# Patient Record
Sex: Male | Born: 1965 | Race: White | Hispanic: No | Marital: Married | Smoking: Never smoker
Health system: Southern US, Community
[De-identification: ages and names within clinical notes are randomized; demographics above are authoritative.]

## PROBLEM LIST (undated history)

## (undated) DIAGNOSIS — I2699 Other pulmonary embolism without acute cor pulmonale: Secondary | ICD-10-CM

## (undated) HISTORY — DX: Other pulmonary embolism without acute cor pulmonale: I26.99

---

## 1988-11-07 HISTORY — PX: OTHER SURGICAL HISTORY: SHX169

## 2006-09-30 ENCOUNTER — Ambulatory Visit (HOSPITAL_COMMUNITY): Admission: RE | Admit: 2006-09-30 | Discharge: 2006-09-30 | Payer: Self-pay | Admitting: Cardiology

## 2011-11-04 ENCOUNTER — Other Ambulatory Visit (HOSPITAL_COMMUNITY): Payer: Self-pay | Admitting: Orthopedic Surgery

## 2011-11-04 DIAGNOSIS — M84363A Stress fracture, right fibula, initial encounter for fracture: Secondary | ICD-10-CM

## 2011-11-12 ENCOUNTER — Encounter (HOSPITAL_COMMUNITY): Payer: Self-pay

## 2011-11-12 ENCOUNTER — Other Ambulatory Visit (HOSPITAL_COMMUNITY): Payer: Self-pay

## 2011-11-16 ENCOUNTER — Encounter (HOSPITAL_COMMUNITY)
Admission: RE | Admit: 2011-11-16 | Discharge: 2011-11-16 | Disposition: A | Discharge status: Home / Self Care | Source: Ambulatory Visit | Attending: Orthopedic Surgery | Admitting: Orthopedic Surgery

## 2011-11-16 DIAGNOSIS — M84369A Stress fracture, unspecified tibia and fibula, initial encounter for fracture: Secondary | ICD-10-CM | POA: Insufficient documentation

## 2011-11-16 DIAGNOSIS — M84363A Stress fracture, right fibula, initial encounter for fracture: Secondary | ICD-10-CM

## 2011-11-16 DIAGNOSIS — X58XXXA Exposure to other specified factors, initial encounter: Secondary | ICD-10-CM | POA: Insufficient documentation

## 2011-11-16 MED ORDER — TECHNETIUM TC 99M MEDRONATE IV KIT
25.0000 | PACK | Freq: Once | INTRAVENOUS | Status: AC | PRN
  Administered 2011-11-16: 25 via INTRAVENOUS

## 2012-05-30 ENCOUNTER — Telehealth: Payer: Self-pay | Admitting: Physician Assistant

## 2012-05-30 NOTE — Telephone Encounter (Signed)
wtbs today or tomorrow with acm

## 2012-05-30 NOTE — Telephone Encounter (Signed)
APPT MADE

## 2012-06-03 ENCOUNTER — Telehealth: Payer: Self-pay | Admitting: Nurse Practitioner

## 2012-06-03 NOTE — Telephone Encounter (Signed)
Patient wants inr checked when comes in

## 2012-06-22 ENCOUNTER — Ambulatory Visit (INDEPENDENT_AMBULATORY_CARE_PROVIDER_SITE_OTHER): Admitting: Nurse Practitioner

## 2012-06-22 ENCOUNTER — Encounter: Payer: Self-pay | Admitting: Nurse Practitioner

## 2012-06-22 VITALS — BP 128/79 | HR 74 | Temp 98.1°F | Ht 72.0 in | Wt 204.0 lb

## 2012-06-22 DIAGNOSIS — I2699 Other pulmonary embolism without acute cor pulmonale: Secondary | ICD-10-CM

## 2012-06-22 NOTE — Progress Notes (Signed)
  Subjective:    Patient ID: Allen Watson, male    DOB: 1965-07-27, 47 y.o.   MRN: [id number]  HPI- Patient in stating that had pulmonary emboli in 2008. Has been on Coumadin since then. Sees clinical pharmacist monthly. Regulated without in bleeding or sideeffects. 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    Review of Systems  All other systems reviewed and are negative.       Objective:   Physical Exam  Constitutional: He is oriented to person, place, and time. He appears well-developed and well-nourished.  Cardiovascular: Normal rate, regular rhythm, normal heart sounds and intact distal pulses.   Pulmonary/Chest: Effort normal and breath sounds normal.  Neurological: He is alert and oriented to person, place, and time.  Psychiatric: He has a normal mood and affect. His behavior is normal. Judgment and thought content normal.   BP 128/79  Pulse 74  Temp(Src) 98.1 F (36.7 C) (Oral)  Ht 6' (1.829 m)  Wt 204 lb (92.534 kg)  BMI 27.66 kg/m2        Assessment & Plan:  Hx pulmonary emboli with anitcoag treatment  Letter written for flying planes  Continue all meds RTO in 6 months [name] [name], FNP

## 2012-06-22 NOTE — Addendum Note (Signed)
Addended by: Prescott Gum on: 06/22/2012 12:50 PM   Modules accepted: Orders

## 2012-06-22 NOTE — Addendum Note (Signed)
Addended by: [name] on: 06/22/2012 12:43 PM   Modules accepted: Orders

## 2012-06-22 NOTE — Patient Instructions (Addendum)
Pulmonary Embolus A pulmonary (lung) embolus (PE) is a blood clot that has traveled from another place in the body to the lung. Most clots come from deep veins in the legs or pelvis. PE is a dangerous and potentially life-threatening condition that can be treated if identified. CAUSES Blood clots form in a vein for different reasons. Usually several things cause blood clots. They include:  The flow of blood slows down.  The inside of the vein is damaged in some way.  The person has a condition that makes the blood clot more easily. These conditions may include:  Older age (especially over 75 years old).  Having a history of blood clots.  Having major or lengthy surgery. Hip surgery is particularly high-risk.  Breaking a hip or leg.  Sitting or lying still for a long time.  Cancer or cancer treatment.  Having a long, thin tube (catheter) placed inside a vein during a medical procedure.  Being overweight (obese).  Pregnancy and childbirth.  Medicines with estrogen.  Smoking.  Other circulation or heart problems. SYMPTOMS  The symptoms of a PE usually start suddenly and include:  Shortness of breath.  Coughing.  Coughing up blood or blood-tinged mucus (phlegm).  Chest pain. Pain is often worse with deep breaths.  Rapid heartbeat. DIAGNOSIS  If a PE is suspected, your caregiver will take a medical history and carry out a physical exam. Your caregiver will check for the risk factors listed above. Tests that also may be required include:  Blood tests, including studies of the clotting properties of your blood.  Imaging tests. Ultrasound, CT, MRI, and other tests can all be used to see if you have clots in your legs or lungs. If you have a clot in your legs and have breathing or chest problems, your caregiver may conclude that you have a clot in your lungs. Further lung tests may not be needed.  Electrocardiography can look for heart strain from blood clots in the  lungs. PREVENTION   Exercise the legs regularly. Take a brisk 30 minute walk every day.  Maintain a weight that is appropriate for your height.  Avoid sitting or lying in bed for long periods of time without moving your legs.  Women, particularly those over the age of 35, should consider the risks and benefits of taking estrogen medicines, including birth control pills.  Do not smoke, especially if you take estrogen medicines.  Long-distance travel can increase your risk. You should exercise your legs by walking or pumping the muscles every hour.  In hospital prevention:  Your caregiver will assess your need for preventive PE care (prophylaxis) when you are admitted to the hospital. If you are having surgery, your surgeon will assess you the day of or day after surgery.  Prevention may include medical and nonmedical measures. TREATMENT   The most common treatment for a PE is blood thinning (anticoagulant) medicine, which reduces the blood's tendency to clot. Anticoagulants can stop new blood clots from forming and old ones from growing. They cannot dissolve existing clots. Your body does this by itself over time. Anticoagulants can be given by mouth, by intravenous (IV) access, or by injection. Your caregiver will determine the best program for you.  Less commonly, clot-dissolving drugs (thrombolytics) are used to dissolve a PE. They carry a high risk of bleeding, so they are used mainly in severe cases.  Very rarely, a blood clot in the leg needs to be removed surgically.  If you are unable to   take anticoagulants, your caregiver may arrange for you to have a filter placed in a main vein in your abdomen. This filter prevents clots from traveling to your lungs. HOME CARE INSTRUCTIONS   Take all medicines prescribed by your caregiver. Follow the directions carefully.  Warfarin. Most people will continue taking warfarin after hospital discharge. Your caregiver will advise you on the  length of treatment (usually 3 6 months, sometimes lifelong).  Too much and too little warfarin are both dangerous. Too much warfarin increases the risk of bleeding. Too little warfarin continues to allow the risk for blood clots. While taking warfarin, you will need to have regular blood tests to measure your blood clotting time. These blood tests usually include both the prothrombin time (PT) and International Normalized Ratio (INR) tests. The PT and INR results allow your caregiver to adjust your dose of warfarin. The dose can change for many reasons. It is critically important that you take warfarin exactly as prescribed, and that you have your PT and INR levels drawn exactly as directed.  Many foods, especially foods high in vitamin K can interfere with warfarin and affect the PT and INR results. Foods high in vitamin K include spinach, kale, broccoli, cabbage, collard and turnip greens, brussels sprouts, peas, cauliflower, seaweed, and parsley as well as beef and pork liver, green tea, and soybean oil. You should eat a consistent amount of foods high in vitamin K. Avoid major changes in your diet, or notify your caregiver before changing your diet. Arrange a visit with a dietitian to answer your questions.  Many medicines can interfere with warfarin and affect the PT and INR results. You must tell your caregiver about any and all medicines you take, this includes all vitamins and supplements. Be especially cautious with aspirin and anti-inflammatory medicines. Ask your caregiver before taking these. Do not take or discontinue any prescribed or over-the-counter medicine except on the advice of your caregiver or pharmacist.  Warfarin can have side effects, such as excessive bruising or bleeding. You will need to hold pressure over cuts for longer than usual.  Alcohol can change the body's ability to handle warfarin. It is best to avoid alcoholic drinks or consume only very small amounts while taking  warfarin. Notify your caregiver if you change your alcohol intake.  Notify your dentist or other caregivers before procedures.  Avoid contact sports.  Wear a medical alert bracelet or carry a medical alert card.  Ask your caregiver how soon you can go back to normal activities. Not being active can lead to new clots. Ask for a list of what you should and should not do.  Compression stockings. These are tight elastic stockings that apply pressure to the lower legs. This can help keep the blood in the legs from clotting. You may need to wear compressions stockings at home to help prevent clots.  Smoking. If you smoke, quit. Ask your caregiver for help with quitting smoking.  Learn as much as you can about PE. Educating yourself can help prevent PE from reoccurring. SEEK MEDICAL CARE IF:   You notice a rapid heartbeat.  You feel weaker or more tired than usual.  You feel faint.  You notice increased bruising.  Your symptoms are not getting better in the time expected.  You are having side effects of medicine. SEEK IMMEDIATE MEDICAL CARE IF:   You have chest pain.  You have trouble breathing.  You have new or increased swelling or pain in one leg.  You   cough up blood.  You notice blood in vomit, in a bowel movement, or in urine.  You have an oral temperature above 102 F (38.9 C), not controlled by medicine. You may have another PE. A blood clot in the lungs is a medical emergency. Call your local emergency services (911 in [place] to get to the nearest hospital or clinic. Do not drive yourself. MAKE SURE YOU:   Understand these instructions.  Will watch your condition.  Will get help right away if you are not doing well or get worse. Document Released: 02/21/2000 Document Revised: 08/25/2011 Document Reviewed: 08/27/2008 [facility] Patient Information 2013 [place], [place]. Anticoagulation Dose Instructions as of 06/22/2012     Glynis Smiles Tue Wed Thu Fri Sat   New Dose 10 mg  15 mg 10 mg 15 mg 10 mg 15 mg 10 mg     Recheck INR in 1 month

## 2012-06-28 NOTE — Telephone Encounter (Signed)
Pt was seen and had inr checked

## 2012-07-01 ENCOUNTER — Ambulatory Visit (INDEPENDENT_AMBULATORY_CARE_PROVIDER_SITE_OTHER): Admitting: Nurse Practitioner

## 2012-07-01 VITALS — BP 150/90 | HR 68 | Temp 98.0°F | Ht 72.0 in | Wt 205.0 lb

## 2012-07-01 DIAGNOSIS — R079 Chest pain, unspecified: Secondary | ICD-10-CM

## 2012-07-01 LAB — COMPLETE METABOLIC PANEL WITH GFR
AST: 27 U/L (ref 0–37)
Albumin: 4.5 g/dL (ref 3.5–5.2)
Alkaline Phosphatase: 63 U/L (ref 39–117)
Glucose, Bld: 87 mg/dL (ref 70–99)
Potassium: 4.5 mEq/L (ref 3.5–5.3)
Sodium: 142 mEq/L (ref 135–145)
Total Protein: 8 g/dL (ref 6.0–8.3)

## 2012-07-01 NOTE — Patient Instructions (Addendum)
Chest Pain (Nonspecific) It is often hard to give a specific diagnosis for the cause of chest pain. There is always a chance that your pain could be related to something serious, such as a heart attack or a blood clot in the lungs. You need to follow up with your caregiver for further evaluation. CAUSES   Heartburn.  Pneumonia or bronchitis.  Anxiety or stress.  Inflammation around your heart (pericarditis) or lung (pleuritis or pleurisy).  A blood clot in the lung.  A collapsed lung (pneumothorax). It can develop suddenly on its own (spontaneous pneumothorax) or from injury (trauma) to the chest.  Shingles infection (herpes zoster virus). The chest wall is composed of bones, muscles, and cartilage. Any of these can be the source of the pain.  The bones can be bruised by injury.  The muscles or cartilage can be strained by coughing or overwork.  The cartilage can be affected by inflammation and become sore (costochondritis). DIAGNOSIS  Lab tests or other studies, such as X-rays, electrocardiography, stress testing, or cardiac imaging, may be needed to find the cause of your pain.  TREATMENT   Treatment depends on what may be causing your chest pain. Treatment may include:  Acid blockers for heartburn.  Anti-inflammatory medicine.  Pain medicine for inflammatory conditions.  Antibiotics if an infection is present.  You may be advised to change lifestyle habits. This includes stopping smoking and avoiding alcohol, caffeine, and chocolate.  You may be advised to keep your head raised (elevated) when sleeping. This reduces the chance of acid going backward from your stomach into your esophagus.  Most of the time, nonspecific chest pain will improve within 2 to 3 days with rest and mild pain medicine. HOME CARE INSTRUCTIONS   If antibiotics were prescribed, take your antibiotics as directed. Finish them even if you start to feel better.  For the next few days, avoid physical  activities that bring on chest pain. Continue physical activities as directed.  Do not smoke.  Avoid drinking alcohol.  Only take over-the-counter or prescription medicine for pain, discomfort, or fever as directed by your caregiver.  Follow your caregiver's suggestions for further testing if your chest pain does not go away.  Keep any follow-up appointments you made. If you do not go to an appointment, you could develop lasting (chronic) problems with pain. If there is any problem keeping an appointment, you must call to reschedule. SEEK MEDICAL CARE IF:   You think you are having problems from the medicine you are taking. Read your medicine instructions carefully.  Your chest pain does not go away, even after treatment.  You develop a rash with blisters on your chest. SEEK IMMEDIATE MEDICAL CARE IF:   You have increased chest pain or pain that spreads to your arm, neck, jaw, back, or abdomen.  You develop shortness of breath, an increasing cough, or you are coughing up blood.  You have severe back or abdominal pain, feel nauseous, or vomit.  You develop severe weakness, fainting, or chills.  You have a fever. THIS IS AN EMERGENCY. Do not wait to see if the pain will go away. Get medical help at once. Call your local emergency services (911 in [place] Do not drive yourself to the hospital. MAKE SURE YOU:   Understand these instructions.  Will watch your condition.  Will get help right away if you are not doing well or get worse. Document Released: 12/03/2004 Document Revised: 05/18/2011 Document Reviewed: 09/29/2007 [employer] Patient Information 2013 [employer].  

## 2012-07-01 NOTE — Progress Notes (Signed)
  Subjective:    Patient ID: Allen Watson, male    DOB: 23-Jan-1966, 47 y.o.   MRN: [id number]  HPIPatient in today C/O chest pain. Started Monday night and has had it everyday intermittently . Describes pain as a dull ache. Rates pain 2/10. Exercise seems to help pain. Nothing seems to make it worse. Patient has been exercising and denies SOB with exertion. Family hx- dad irregular heart beat- PGF diad of heart attack in upper 50's    Review of Systems  Constitutional: Negative.   Eyes: Negative.   Respiratory: Positive for chest tightness. Negative for cough, shortness of breath and stridor.   Cardiovascular: Positive for chest pain. Negative for palpitations and leg swelling.  Genitourinary: Negative.   Musculoskeletal: Negative.   Hematological: Negative.   Psychiatric/Behavioral: Negative.        Objective:   Physical Exam  Constitutional: He appears well-developed and well-nourished.  Cardiovascular: Normal rate, regular rhythm and normal heart sounds.   EKG NSR  Pulmonary/Chest: Effort normal and breath sounds normal. No respiratory distress. He has no wheezes. He has no rales. He exhibits no tenderness.  Skin: Skin is warm and dry.  Psychiatric: He has a normal mood and affect. His behavior is normal. Judgment and thought content normal.   BP 150/90  Pulse 68  Temp(Src) 98 F (36.7 C) (Oral)  Ht 6' (1.829 m)  Wt 205 lb (92.987 kg)  BMI 27.8 kg/m2        Assessment & Plan:  1. 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

## 2012-07-04 ENCOUNTER — Encounter: Payer: Self-pay | Admitting: Cardiovascular Disease

## 2012-07-04 ENCOUNTER — Ambulatory Visit (INDEPENDENT_AMBULATORY_CARE_PROVIDER_SITE_OTHER): Admitting: Cardiovascular Disease

## 2012-07-04 VITALS — BP 132/86 | HR 69 | Ht 72.0 in | Wt 206.0 lb

## 2012-07-04 DIAGNOSIS — R079 Chest pain, unspecified: Secondary | ICD-10-CM | POA: Insufficient documentation

## 2012-07-04 DIAGNOSIS — I2699 Other pulmonary embolism without acute cor pulmonale: Secondary | ICD-10-CM

## 2012-07-04 LAB — NMR LIPOPROFILE WITH LIPIDS
Cholesterol, Total: 252 mg/dL — ABNORMAL HIGH (ref <200)
Large HDL-P: 9.7 umol/L (ref >=4.8)
Large VLDL-P: 1.1 nmol/L (ref <=2.7)
Triglycerides: 114 mg/dL (ref <150)

## 2012-07-04 NOTE — Addendum Note (Signed)
Addended by: Scherrie Bateman E on: 07/04/2012 12:03 PM   Modules accepted: Orders

## 2012-07-04 NOTE — Assessment & Plan Note (Signed)
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 Would also check CXR to clear aorta and mediastinum

## 2012-07-04 NOTE — Progress Notes (Signed)
Patient ID: Allen Watson, male   DOB: [birth year], 47 y.o.   MRN: [id number] 47 yo referred by primary for chest pain,  4.25  C/O chest pain. Started Monday night and has had it everyday intermittently . Describes pain as a dull ache. Rates pain 2/10. Exercise seems to help pain. Nothing seems to make it worse. Patient has been exercising and denies SOB with exertion. 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 Discussed issues with long term anticoagulation and possibility using xarelto instead as it has an indication for DVT/PE  Family hx- dad irregular heart beat- PGF diad of heart attack in upper 50's  ROS: Denies fever, malais, weight loss, blurry vision, decreased visual acuity, cough, sputum, SOB, hemoptysis, pleuritic pain, palpitaitons, heartburn, abdominal pain, melena, lower extremity edema, claudication, or rash.  All other systems reviewed and negative   General: Affect appropriate Healthy:  appears stated age HEENT: normal Neck supple with no adenopathy JVP normal no bruits no thyromegaly Lungs clear with no wheezing and good diaphragmatic motion Heart:  S1/S2 no murmur,rub, gallop or click PMI normal Abdomen: benighn, BS positve, no tenderness, no AAA no bruit.  No HSM or HJR Distal pulses intact with no bruits No edema Neuro non-focal Skin warm and dry No muscular weakness  Medications Current Outpatient Prescriptions  Medication Sig Dispense Refill  . warfarin (COUMADIN) 10 MG tablet Take 10 mg by mouth daily.       No current facility-administered medications for this visit.    Allergies Review of patient's allergies indicates no known allergies.  Family History: Family History  Problem Relation Age of Onset  . Bipolar disorder Mother   . Diabetes Father     Social History: History   Social History  . Marital Status: Married    Spouse Name: N/A    Number of Children: N/A  . Years of  Education: N/A   Occupational History  . Not on file.   Social History Main Topics  . Smoking status: Never Smoker   . Smokeless tobacco: Not on file  . Alcohol Use: Yes  . Drug Use: No  . Sexually Active: Not on file   Other Topics Concern  . Not on file   Social History Narrative  . No narrative on file    Electrocardiogram:4/25  NSR normal ECG   Assessment and Plan

## 2012-07-04 NOTE — Assessment & Plan Note (Signed)
Encouraged him to f/u with primary and or hematology Not clear to me that he should be on life long anticoagulaton of hypercoag tests were negative and first PE diagnosis in 2004 was only "possible" He is young and the cumulative life time risk of coumadin is not trivial

## 2012-07-04 NOTE — Patient Instructions (Addendum)
Your physician has requested that you have an exercise tolerance test. For further information please visit [email or web address] Please also follow instruction sheet, as given.  ASAP  MAY HAVE DONE AT HOSPITAL  Your physician recommends that you continue on your current medications as directed. Please refer to the Current Medication list given to you today. A chest x-ray takes a picture of the organs and structures inside the chest, including the heart, lungs, and blood vessels. This test can show several things, including, whether the heart is enlarges; whether fluid is building up in the lungs; and whether pacemaker / defibrillator leads are still in place.

## 2012-07-05 ENCOUNTER — Ambulatory Visit (HOSPITAL_COMMUNITY)
Admission: RE | Admit: 2012-07-05 | Discharge: 2012-07-05 | Disposition: A | Discharge status: Home / Self Care | Source: Ambulatory Visit | Attending: Cardiovascular Disease | Admitting: Cardiovascular Disease

## 2012-07-05 ENCOUNTER — Encounter: Payer: Self-pay | Admitting: Physician Assistant

## 2012-07-05 ENCOUNTER — Telehealth: Payer: Self-pay | Admitting: Nurse Practitioner

## 2012-07-05 DIAGNOSIS — I2699 Other pulmonary embolism without acute cor pulmonale: Secondary | ICD-10-CM

## 2012-07-05 DIAGNOSIS — R079 Chest pain, unspecified: Secondary | ICD-10-CM

## 2012-07-05 DIAGNOSIS — Z86711 Personal history of pulmonary embolism: Secondary | ICD-10-CM | POA: Insufficient documentation

## 2012-07-05 DIAGNOSIS — M47814 Spondylosis without myelopathy or radiculopathy, thoracic region: Secondary | ICD-10-CM | POA: Insufficient documentation

## 2012-07-05 DIAGNOSIS — R0602 Shortness of breath: Secondary | ICD-10-CM | POA: Insufficient documentation

## 2012-07-05 NOTE — Telephone Encounter (Signed)
Pt aware of lab results 

## 2012-07-05 NOTE — Progress Notes (Signed)
ETT done. Patient exercised 15:00 on Bruce protocol without acute EKG changes or arrhythmia. Excellent exercise tolerance. No CP with exertion. Await official read in Epic but preliminarily negative. Given that he may need clearance from [employer], I have given his original EKG tracings from the stress test to Daun Peacock, clinical site manager, who will be helping to get them to the office via courier. I also placed a note on these tracings that they should go into patient's paper chart at the office. Patient left stress lab in good condition. Dayna Dunn PA-C

## 2012-07-07 ENCOUNTER — Telehealth: Payer: Self-pay | Admitting: Cardiovascular Disease

## 2012-07-07 NOTE — Telephone Encounter (Signed)
PT AWARE OF GXT AND CXR  RESULTS  NORMAL PER DR NISHAN./CY

## 2012-07-07 NOTE — Telephone Encounter (Signed)
New problem   Pt had stress test and cxr and want to know results of test and speak to a nurse. Please call pt

## 2012-07-08 ENCOUNTER — Encounter: Payer: Self-pay | Admitting: Cardiovascular Disease

## 2012-07-14 ENCOUNTER — Ambulatory Visit (INDEPENDENT_AMBULATORY_CARE_PROVIDER_SITE_OTHER): Admitting: Pharmacist

## 2012-07-14 ENCOUNTER — Telehealth: Payer: Self-pay | Admitting: Cardiovascular Disease

## 2012-07-14 DIAGNOSIS — Z86711 Personal history of pulmonary embolism: Secondary | ICD-10-CM

## 2012-07-14 DIAGNOSIS — Z7901 Long term (current) use of anticoagulants: Secondary | ICD-10-CM

## 2012-07-14 DIAGNOSIS — I2699 Other pulmonary embolism without acute cor pulmonale: Secondary | ICD-10-CM

## 2012-07-14 NOTE — Telephone Encounter (Signed)
New Problem:    Patient callerd in wanting to speak with you about receiving a copy of his results from his latest stress test that he had.  Patient stats that the results still have not appeared in his mychart profile.  Please call back.

## 2012-07-14 NOTE — Telephone Encounter (Signed)
PER PT  WAS IN [place]  TODAY AND  STOPPED  BY EARLIER AND WAS ABLE TO GET HARD COPIES  OF  TEST RESULTS PT MAILED  INFO TO EMPLOYER  PT STATES IF NEEDS ADDITIONAL INFO WILL CALL ./CY

## 2012-08-11 ENCOUNTER — Ambulatory Visit (INDEPENDENT_AMBULATORY_CARE_PROVIDER_SITE_OTHER): Admitting: Pharmacist

## 2012-08-11 DIAGNOSIS — I2699 Other pulmonary embolism without acute cor pulmonale: Secondary | ICD-10-CM

## 2012-08-11 LAB — POCT INR: INR: 3.1

## 2012-08-11 NOTE — Patient Instructions (Signed)
Anticoagulation Dose Instructions as of 08/11/2012     Allen Watson Tue Wed Thu Fri Sat   New Dose 10 mg 15 mg 10 mg 15 mg 10 mg 15 mg 10 mg    Description       Take 1 tablet instead of 2 tablets today.  Then restart current dose.  Recheck protime in 1 month      INR was 3.1 today

## 2012-08-15 ENCOUNTER — Telehealth: Payer: Self-pay | Admitting: Cardiovascular Disease

## 2012-08-15 NOTE — Telephone Encounter (Addendum)
Letter on letter head that states that based on the test that he did that the symptoms he had was not his heart.  Need an opinion as to what it may have been if you have one.  Send To Dr Erskine Squibb fax [phone]   Please call patient when letter has been faxed

## 2012-08-15 NOTE — Telephone Encounter (Signed)
New Problem  Pt states he needs a letter regarding his last visit for the [employer]. He asked if you could call him back .

## 2012-08-22 NOTE — Telephone Encounter (Signed)
His chest pain was atypical. ETT and CXR were normal I cannot speculate what else it might be but does not appear to be heart. Can send this in letter

## 2012-08-23 ENCOUNTER — Encounter: Payer: Self-pay | Admitting: *Deleted

## 2012-08-23 NOTE — Telephone Encounter (Signed)
LEFT MESSAGE THAT  LETTER WAS FAXED TO DR Marletta Lor SEE LETTERS TAB  AS REQUESTED .Zack Seal

## 2012-09-12 ENCOUNTER — Encounter: Payer: Self-pay | Admitting: Pharmacist

## 2012-09-12 ENCOUNTER — Ambulatory Visit (INDEPENDENT_AMBULATORY_CARE_PROVIDER_SITE_OTHER): Admitting: Pharmacist

## 2012-09-12 DIAGNOSIS — I2699 Other pulmonary embolism without acute cor pulmonale: Secondary | ICD-10-CM

## 2012-09-12 LAB — POCT INR: INR: 2.8

## 2012-09-12 NOTE — Patient Instructions (Addendum)
Anticoagulation Dose Instructions as of 09/12/2012     Glynis Smiles Tue Wed Thu Fri Sat   New Dose 10 mg 15 mg 10 mg 15 mg 10 mg 15 mg 10 mg    Description       Take 1 tablet instead of 2 tablets today.  Then restart current dose.  Recheck protime in 1 month       INR was 2.8 today

## 2012-10-24 ENCOUNTER — Telehealth: Payer: Self-pay | Admitting: Nurse Practitioner

## 2012-10-24 ENCOUNTER — Ambulatory Visit (INDEPENDENT_AMBULATORY_CARE_PROVIDER_SITE_OTHER): Admitting: Pharmacist

## 2012-10-24 DIAGNOSIS — I2699 Other pulmonary embolism without acute cor pulmonale: Secondary | ICD-10-CM

## 2012-10-24 NOTE — Patient Instructions (Signed)
Anticoagulation Dose Instructions as of 10/24/2012     Glynis Smiles Tue Wed Thu Fri Sat   New Dose 10 mg 15 mg 10 mg 15 mg 10 mg 15 mg 10 mg    Description       No warfarin today 8/18 and take only 1 tablet tomorrow 8/19.  Then start 2 tablets daily except on Monday and Fridays take 3 tablets.       INR was 3.9 today

## 2012-10-24 NOTE — Telephone Encounter (Signed)
Patient said his daughter too a message from our office that his labs were normal and he wanted to know how to take warfarin. Explained to patient that the INR we drew and sent out to confirm the POC INR has not returned yet and to still continue as planned to not take warfarin today.  I will call patient with further instructions once results are available.

## 2012-10-25 ENCOUNTER — Telehealth: Payer: Self-pay | Admitting: Pharmacist

## 2012-10-25 ENCOUNTER — Encounter: Payer: Self-pay | Admitting: Pharmacist

## 2012-10-25 LAB — PROTIME-INR
INR: 3.4 — ABNORMAL HIGH (ref 0.8–1.2)
Prothrombin Time: 35 s — ABNORMAL HIGH (ref 9.1–12.0)

## 2012-10-25 NOTE — Telephone Encounter (Signed)
Patient notified of PT/INR results that were sent out to confirm point of care PT/INR.   He was instructed to start new dose of 10mg  daily except 15mg  Mondays and Fridays today and recheck as planned 11/08/12.

## 2012-10-29 ENCOUNTER — Other Ambulatory Visit: Payer: Self-pay | Admitting: Family Medicine

## 2012-10-31 NOTE — Telephone Encounter (Signed)
LAST PT 10/24/12. SEE LABS

## 2012-11-08 ENCOUNTER — Ambulatory Visit (INDEPENDENT_AMBULATORY_CARE_PROVIDER_SITE_OTHER): Admitting: Pharmacist Clinician (PhC)/ Clinical Pharmacy Specialist

## 2012-11-08 DIAGNOSIS — I2699 Other pulmonary embolism without acute cor pulmonale: Secondary | ICD-10-CM

## 2012-11-22 ENCOUNTER — Ambulatory Visit (INDEPENDENT_AMBULATORY_CARE_PROVIDER_SITE_OTHER): Admitting: Pharmacist Clinician (PhC)/ Clinical Pharmacy Specialist

## 2012-11-22 DIAGNOSIS — I2699 Other pulmonary embolism without acute cor pulmonale: Secondary | ICD-10-CM

## 2012-11-22 DIAGNOSIS — G250 Essential tremor: Secondary | ICD-10-CM

## 2012-11-22 DIAGNOSIS — R5381 Other malaise: Secondary | ICD-10-CM

## 2012-11-22 DIAGNOSIS — R7309 Other abnormal glucose: Secondary | ICD-10-CM

## 2012-11-23 LAB — BASIC METABOLIC PANEL
CO2: 24 mmol/L (ref 18–29)
Calcium: 9.3 mg/dL (ref 8.7–10.2)
Chloride: 103 mmol/L (ref 97–108)
Creatinine, Ser: 0.83 mg/dL (ref 0.76–1.27)
Sodium: 142 mmol/L (ref 134–144)

## 2012-11-23 LAB — HEPATIC FUNCTION PANEL
ALT: 14 IU/L (ref 0–44)
Alkaline Phosphatase: 60 IU/L (ref 39–117)
Bilirubin, Direct: 0.09 mg/dL (ref 0.00–0.40)
Total Protein: 6.7 g/dL (ref 6.0–8.5)

## 2012-11-23 LAB — ANEMIA PROFILE B
Basos: 1 %
Eos: 4 %
Folate: 18.7 ng/mL (ref >3.0)
Hemoglobin: 14.2 g/dL (ref 12.6–17.7)
Immature Grans (Abs): 0 10*3/uL (ref 0.0–0.1)
Immature Granulocytes: 0 %
Lymphs: 35 %
MCH: 29.7 pg (ref 26.6–33.0)
Monocytes: 11 %
Neutrophils Relative %: 49 %
RBC: 4.78 x10E6/uL (ref 4.14–5.80)
Retic Ct Pct: 1 % (ref 0.6–2.6)
UIBC: 179 ug/dL (ref 150–375)
Vitamin B-12: 870 pg/mL (ref 211–946)
WBC: 7.1 10*3/uL (ref 3.4–10.8)

## 2012-11-23 LAB — TESTOSTERONE,FREE AND TOTAL: Testosterone: 717 ng/dL (ref 348–1197)

## 2012-11-23 LAB — PSA: PSA: 0.5 ng/mL (ref 0.0–4.0)

## 2012-11-24 LAB — NMR, LIPOPROFILE
Cholesterol: 207 mg/dL — ABNORMAL HIGH
HDL Cholesterol by NMR: 68 mg/dL
HDL Particle Number: 43 umol/L
LDL Particle Number: 1074 nmol/L — ABNORMAL HIGH
LDL Size: 22.1 nm
LDLC SERPL CALC-MCNC: 120 mg/dL — ABNORMAL HIGH
LP-IR Score: 25
Small LDL Particle Number: <90 nmol/L
Triglycerides by NMR: 93 mg/dL

## 2012-12-19 ENCOUNTER — Ambulatory Visit (INDEPENDENT_AMBULATORY_CARE_PROVIDER_SITE_OTHER): Admitting: Pharmacist

## 2012-12-19 DIAGNOSIS — I2699 Other pulmonary embolism without acute cor pulmonale: Secondary | ICD-10-CM

## 2012-12-19 DIAGNOSIS — Z23 Encounter for immunization: Secondary | ICD-10-CM

## 2013-01-16 ENCOUNTER — Ambulatory Visit (INDEPENDENT_AMBULATORY_CARE_PROVIDER_SITE_OTHER)

## 2013-01-16 ENCOUNTER — Encounter: Payer: Self-pay | Admitting: General Practice

## 2013-01-16 ENCOUNTER — Ambulatory Visit (INDEPENDENT_AMBULATORY_CARE_PROVIDER_SITE_OTHER): Admitting: General Practice

## 2013-01-16 VITALS — BP 127/86 | HR 70 | Temp 98.1°F | Ht 72.0 in | Wt 220.0 lb

## 2013-01-16 DIAGNOSIS — M79642 Pain in left hand: Secondary | ICD-10-CM

## 2013-01-16 DIAGNOSIS — M79609 Pain in unspecified limb: Secondary | ICD-10-CM

## 2013-01-16 DIAGNOSIS — S6000XA Contusion of unspecified finger without damage to nail, initial encounter: Secondary | ICD-10-CM

## 2013-01-16 NOTE — Patient Instructions (Signed)

## 2013-01-16 NOTE — Progress Notes (Signed)
  Subjective:    Patient ID: Allen Watson, male    DOB: 08/19/1965, 47 y.o.   MRN: [id number]  Hand Pain  The incident occurred 12 to 24 hours ago. [place] The injury mechanism was a direct blow. The pain is present in the left hand. The quality of the pain is described as aching. The pain does not radiate. The pain is at a severity of 1/10. The pain has been intermittent since the incident. Pertinent negatives include no chest pain, muscle weakness, numbness or tingling. The symptoms are aggravated by movement. He has tried ice and elevation for the symptoms.  Patient reports his wedding band is very tight on left hand ring finger, he is unable to remove ring or rotate it. He is questioning if band needs to be cut off finger.      Review of Systems  Constitutional: Negative for fever and chills.  Respiratory: Negative for chest tightness and shortness of breath.   Cardiovascular: Negative for chest pain.  Musculoskeletal:       Left hand ring finger bruised and swollen  Neurological: Negative for tingling and numbness.       Objective:   Physical Exam  Constitutional: He is oriented to person, place, and time. He appears well-developed and well-nourished.  Cardiovascular: Normal rate, regular rhythm and normal heart sounds.   Pulmonary/Chest: Effort normal and breath sounds normal.  Musculoskeletal:  Left hand ringer finger bruised and swollen, 1+ non pitting edema. Patient having difficulty bending ring finger. Gold wedding band on finger, attempted remove with out success. Capillary refill less than 3 seconds, +2 radial pulse left.   Neurological: He is alert and oriented to person, place, and time.  Skin: Skin is warm and dry.  Psychiatric: He has a normal mood and affect.    [facility] reading (PRIMARY) by Coralie Keens, FNP-C, no fracture or dislocation noted to left hand.                                         Assessment & Plan:  1. Hand pain,  left  - DG Hand Complete Left; Future  2. Superficial bruising of finger, initial encounter -discussed possible circulation risk if wedding band remains on finger -Patient verbalized understanding and in agreement with wedding band being cut off -Wedding band cut off, negative injury to left hand ring finger from removal, patient demonstrated full range of motion after ring removal -continue ice therapy for next two days and elevation -RTO if symptoms worsen or unresolved, may seek emergency medical treatment -Patient verbalized understanding -Coralie Keens, FNP-C

## 2013-01-23 ENCOUNTER — Ambulatory Visit (INDEPENDENT_AMBULATORY_CARE_PROVIDER_SITE_OTHER): Admitting: Pharmacist

## 2013-01-23 DIAGNOSIS — I2699 Other pulmonary embolism without acute cor pulmonale: Secondary | ICD-10-CM

## 2013-01-23 NOTE — Patient Instructions (Signed)
Anticoagulation Dose Instructions as of 01/23/2013     Allen Watson Tue Wed Thu Fri Sat   New Dose 10 mg 15 mg 10 mg 10 mg 10 mg 15 mg 10 mg    Description       Continue 2 tablets daily except 3 tablets on Mondays and Fridays      INR was 2.7 today

## 2013-02-27 ENCOUNTER — Ambulatory Visit (INDEPENDENT_AMBULATORY_CARE_PROVIDER_SITE_OTHER): Admitting: Nurse Practitioner

## 2013-02-27 ENCOUNTER — Ambulatory Visit (INDEPENDENT_AMBULATORY_CARE_PROVIDER_SITE_OTHER): Admitting: Pharmacist

## 2013-02-27 ENCOUNTER — Encounter: Payer: Self-pay | Admitting: Nurse Practitioner

## 2013-02-27 VITALS — BP 133/81 | HR 68 | Temp 97.8°F | Ht 72.0 in | Wt 221.0 lb

## 2013-02-27 DIAGNOSIS — I2699 Other pulmonary embolism without acute cor pulmonale: Secondary | ICD-10-CM

## 2013-02-27 DIAGNOSIS — M25549 Pain in joints of unspecified hand: Secondary | ICD-10-CM

## 2013-02-27 DIAGNOSIS — S6992XD Unspecified injury of left wrist, hand and finger(s), subsequent encounter: Secondary | ICD-10-CM

## 2013-02-27 NOTE — Patient Instructions (Signed)
Anticoagulation Dose Instructions as of 02/27/2013     Allen Watson Tue Wed Thu Fri Sat   New Dose 10 mg 15 mg 10 mg 10 mg 10 mg 15 mg 10 mg    Description       Continue 2 tablets daily except 3 tablets on Mondays and Fridays      INR was 2.1 today

## 2013-02-27 NOTE — Progress Notes (Signed)
   Subjective:    Patient ID: Allen Watson, male    DOB: 30-Mar-1965, 47 y.o.   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    Review of Systems  Constitutional: Negative.   Respiratory: Negative.   Cardiovascular: Negative.   All other systems reviewed and are negative.       Objective:   Physical Exam  Constitutional: He appears well-developed and well-nourished.  Cardiovascular: Normal rate and normal heart sounds.   Pulmonary/Chest: Effort normal and breath sounds normal.  Musculoskeletal:  Left ring finger decrease ROM with flexion and extension due to medial PIP joint edema.    BP 133/81  Pulse 68  Temp(Src) 97.8 F (36.6 C) (Oral)  Ht 6' (1.829 m)  Wt 221 lb (100.245 kg)  BMI 29.97 kg/m2       Assessment & Plan:   1. Injury of ring finger, left, subsequent encounter    Orders Placed This Encounter  Procedures  . Ambulatory referral to Orthopedic Surgery    Referral Priority:  Routine    Referral Type:  Surgical    Referral Reason:  Specialty Services Required    Referred to Provider:  Dominica Severin, MD    Requested Specialty:  Orthopedic Surgery    Number of Visits Requested:  1   RTO prn [name] [name], FNP

## 2013-03-23 NOTE — Addendum Note (Signed)
Addended by: Henrene PastorECKARD, Shadoe Cryan on: 03/23/2013 11:44 PM   Modules accepted: Level of Service

## 2013-03-23 NOTE — Progress Notes (Signed)
Patient triaged to Philomena DohenyMae Haliburton, NP

## 2013-04-03 ENCOUNTER — Ambulatory Visit (INDEPENDENT_AMBULATORY_CARE_PROVIDER_SITE_OTHER): Admitting: Pharmacist

## 2013-04-03 DIAGNOSIS — I2699 Other pulmonary embolism without acute cor pulmonale: Secondary | ICD-10-CM

## 2013-04-03 LAB — POCT INR: INR: 2

## 2013-04-03 NOTE — Patient Instructions (Signed)
Anticoagulation Dose Instructions as of 04/03/2013     Allen SmilesSun Mon Tue Wed Thu Fri Sat   New Dose 10 mg 15 mg 10 mg 10 mg 10 mg 15 mg 10 mg    Description       Continue 2 tablets daily except 3 tablets on Mondays and Fridays

## 2013-05-01 ENCOUNTER — Ambulatory Visit (INDEPENDENT_AMBULATORY_CARE_PROVIDER_SITE_OTHER): Admitting: Pharmacist

## 2013-05-01 DIAGNOSIS — I2699 Other pulmonary embolism without acute cor pulmonale: Secondary | ICD-10-CM

## 2013-05-01 LAB — POCT INR: INR: 2.3

## 2013-05-01 NOTE — Patient Instructions (Signed)
Anticoagulation Dose Instructions as of 05/01/2013     Allen SmilesSun Mon Tue Wed Thu Fri Sat   New Dose 10 mg 15 mg 10 mg 10 mg 10 mg 15 mg 10 mg    Description       Continue 2 tablets daily except 3 tablets on Mondays and Fridays      INR was 2.3 today

## 2013-05-29 ENCOUNTER — Ambulatory Visit (INDEPENDENT_AMBULATORY_CARE_PROVIDER_SITE_OTHER): Admitting: Nurse Practitioner

## 2013-05-29 ENCOUNTER — Encounter: Payer: Self-pay | Admitting: Nurse Practitioner

## 2013-05-29 ENCOUNTER — Ambulatory Visit (INDEPENDENT_AMBULATORY_CARE_PROVIDER_SITE_OTHER): Admitting: Pharmacist

## 2013-05-29 VITALS — BP 131/88 | HR 63 | Temp 97.2°F | Ht 72.0 in | Wt 207.0 lb

## 2013-05-29 DIAGNOSIS — I2699 Other pulmonary embolism without acute cor pulmonale: Secondary | ICD-10-CM

## 2013-05-29 DIAGNOSIS — Z86711 Personal history of pulmonary embolism: Secondary | ICD-10-CM

## 2013-05-29 LAB — POCT INR: INR: 2.2

## 2013-05-29 NOTE — Patient Instructions (Signed)
Anticoagulation Dose Instructions as of 05/29/2013     Allen Watson Mon Tue Wed Thu Fri Sat   New Dose 10 mg 15 mg 10 mg 10 mg 10 mg 15 mg 10 mg    Description       Continue 2 tablets daily except 3 tablets on Mondays and Fridays      INR was 2.2 today

## 2013-05-29 NOTE — Progress Notes (Signed)
   Subjective:    Patient ID: Allen Watson, male    DOB: 11/16/1965, 48 y.o.   MRN: [id number]  HPI Patient has history of pulmonary embolism in 2008- has been on coumadin since then- He has had no issue and no bleeding. 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    Review of Systems  Constitutional: Negative.   HENT: Negative.   Respiratory: Negative.   Cardiovascular: Negative.   Gastrointestinal: Negative.   Genitourinary: Negative.   All other systems reviewed and are negative.       Objective:   Physical Exam  Constitutional: He is oriented to person, place, and time. He appears well-developed and well-nourished.  HENT:  Right Ear: External ear normal.  Left Ear: External ear normal.  Nose: Nose normal.  Mouth/Throat: Oropharynx is clear and moist.  Cardiovascular: Normal rate, regular rhythm and normal heart sounds.   Pulmonary/Chest: Effort normal and breath sounds normal.  Neurological: He is alert and oriented to person, place, and time.  Skin: Skin is warm and dry.  Psychiatric: He has a normal mood and affect. His behavior is normal. Judgment and thought content normal.   BP 131/88  Pulse 63  Temp(Src) 97.2 F (36.2 C) (Oral)  Ht 6' (1.829 m)  Wt 207 lb (93.895 kg)  BMI 28.07 kg/m2        Assessment & Plan:  1. Hx pulmonary embolism See letter written to allow him to continue as plane pilot   [name] Daphine DeutscherMartin, FNP

## 2013-05-29 NOTE — Progress Notes (Signed)
Patient also seeing Bennie PieriniMary Margaret Watson No charge for anticoagulation clinic visit

## 2013-06-22 ENCOUNTER — Ambulatory Visit (INDEPENDENT_AMBULATORY_CARE_PROVIDER_SITE_OTHER): Admitting: Pharmacist

## 2013-06-22 DIAGNOSIS — I2699 Other pulmonary embolism without acute cor pulmonale: Secondary | ICD-10-CM

## 2013-06-22 LAB — POCT INR: INR: 2.7

## 2013-06-22 NOTE — Patient Instructions (Signed)
Anticoagulation Dose Instructions as of 06/22/2013     Glynis SmilesSun Mon Tue Wed Thu Fri Sat   New Dose 10 mg 15 mg 10 mg 10 mg 10 mg 15 mg 10 mg    Description       Continue 2 tablets daily except 3 tablets on Mondays and Fridays      INR was 2.7 today

## 2013-07-24 ENCOUNTER — Ambulatory Visit (INDEPENDENT_AMBULATORY_CARE_PROVIDER_SITE_OTHER): Admitting: Pharmacist

## 2013-07-24 DIAGNOSIS — I2699 Other pulmonary embolism without acute cor pulmonale: Secondary | ICD-10-CM

## 2013-07-24 LAB — POCT INR: INR: 2.5

## 2013-07-24 MED ORDER — WARFARIN SODIUM 5 MG PO TABS: ORAL_TABLET | ORAL | Status: DC

## 2013-07-24 NOTE — Patient Instructions (Signed)
.  atnn

## 2013-09-04 ENCOUNTER — Ambulatory Visit (INDEPENDENT_AMBULATORY_CARE_PROVIDER_SITE_OTHER): Admitting: Pharmacist

## 2013-09-04 DIAGNOSIS — I2699 Other pulmonary embolism without acute cor pulmonale: Secondary | ICD-10-CM

## 2013-09-04 LAB — POCT INR: INR: 2.2

## 2013-10-04 ENCOUNTER — Ambulatory Visit (INDEPENDENT_AMBULATORY_CARE_PROVIDER_SITE_OTHER): Admitting: Pharmacist

## 2013-10-04 DIAGNOSIS — I2699 Other pulmonary embolism without acute cor pulmonale: Secondary | ICD-10-CM

## 2013-10-04 LAB — POCT INR: INR: 2.3

## 2013-10-04 NOTE — Patient Instructions (Signed)
Anticoagulation Dose Instructions as of 10/04/2013     Allen SmilesSun Mon Tue Wed Thu Fri Sat   New Dose 10 mg 15 mg 10 mg 10 mg 10 mg 15 mg 10 mg    Description       Continue warfarin 5mg  tablet - 2 tablets daily except 3 tablets on Mondays and Fridays      INR was 2.3 today

## 2013-10-06 ENCOUNTER — Other Ambulatory Visit: Payer: Self-pay | Admitting: Family Medicine

## 2013-11-03 ENCOUNTER — Ambulatory Visit (INDEPENDENT_AMBULATORY_CARE_PROVIDER_SITE_OTHER): Admitting: Pharmacist

## 2013-11-03 DIAGNOSIS — I2699 Other pulmonary embolism without acute cor pulmonale: Secondary | ICD-10-CM

## 2013-11-03 LAB — POCT INR: INR: 2.1

## 2013-11-03 NOTE — Patient Instructions (Signed)
Anticoagulation Dose Instructions as of 11/03/2013     Glynis Smiles Tue Wed Thu Fri Sat   New Dose 10 mg 15 mg 10 mg 10 mg 10 mg 15 mg 10 mg    Description       Continue warfarin  tablet - 2 tablets daily except 3 tablets on Mondays and Fridays

## 2013-12-01 ENCOUNTER — Ambulatory Visit (INDEPENDENT_AMBULATORY_CARE_PROVIDER_SITE_OTHER): Admitting: Pharmacist

## 2013-12-01 DIAGNOSIS — I2699 Other pulmonary embolism without acute cor pulmonale: Secondary | ICD-10-CM

## 2013-12-01 LAB — POCT INR: INR: 2.1

## 2013-12-20 ENCOUNTER — Ambulatory Visit (INDEPENDENT_AMBULATORY_CARE_PROVIDER_SITE_OTHER): Admitting: Pharmacist

## 2013-12-20 DIAGNOSIS — Z23 Encounter for immunization: Secondary | ICD-10-CM

## 2013-12-20 DIAGNOSIS — I2699 Other pulmonary embolism without acute cor pulmonale: Secondary | ICD-10-CM

## 2013-12-20 LAB — POCT INR: INR: 2.6

## 2013-12-20 MED ORDER — WARFARIN SODIUM 5 MG PO TABS: ORAL_TABLET | ORAL | Status: DC

## 2014-01-22 ENCOUNTER — Ambulatory Visit (INDEPENDENT_AMBULATORY_CARE_PROVIDER_SITE_OTHER): Admitting: Pharmacist

## 2014-01-22 DIAGNOSIS — I2699 Other pulmonary embolism without acute cor pulmonale: Secondary | ICD-10-CM

## 2014-01-22 LAB — POCT INR: INR: 2.5

## 2014-01-22 NOTE — Patient Instructions (Signed)
Anticoagulation Dose Instructions as of 01/22/2014      Allen SmilesSun Mon Tue Wed Thu Fri Sat   New Dose 10 mg 15 mg 10 mg 10 mg 10 mg 15 mg 10 mg    Description        Continue warfarin 5mg  tablet - 2 tablets daily except 3 tablets on Mondays and Fridays     INR was 2.5 today

## 2014-02-19 ENCOUNTER — Ambulatory Visit (INDEPENDENT_AMBULATORY_CARE_PROVIDER_SITE_OTHER): Admitting: Pharmacist

## 2014-02-19 DIAGNOSIS — I2699 Other pulmonary embolism without acute cor pulmonale: Secondary | ICD-10-CM

## 2014-02-19 LAB — POCT INR: INR: 2.5

## 2014-02-19 NOTE — Patient Instructions (Signed)
Anticoagulation Dose Instructions as of 02/19/2014      Allen SmilesSun Mon Tue Wed Thu Fri Sat   New Dose 10 mg 15 mg 10 mg 10 mg 10 mg 15 mg 10 mg    Description        Continue warfarin 5mg  tablet - 2 tablets daily except 3 tablets on Mondays and Fridays      INR was 2.5 today

## 2014-03-29 ENCOUNTER — Ambulatory Visit (INDEPENDENT_AMBULATORY_CARE_PROVIDER_SITE_OTHER): Admitting: Pharmacist

## 2014-03-29 DIAGNOSIS — Z86711 Personal history of pulmonary embolism: Secondary | ICD-10-CM

## 2014-03-29 DIAGNOSIS — I2699 Other pulmonary embolism without acute cor pulmonale: Secondary | ICD-10-CM

## 2014-03-29 LAB — POCT INR: INR: 2.4

## 2014-04-02 ENCOUNTER — Ambulatory Visit (INDEPENDENT_AMBULATORY_CARE_PROVIDER_SITE_OTHER): Admitting: Family Medicine

## 2014-04-02 ENCOUNTER — Encounter: Payer: Self-pay | Admitting: Family Medicine

## 2014-04-02 VITALS — BP 135/86 | HR 86 | Temp 99.3°F | Ht 72.0 in | Wt 224.0 lb

## 2014-04-02 DIAGNOSIS — J029 Acute pharyngitis, unspecified: Secondary | ICD-10-CM

## 2014-04-02 DIAGNOSIS — J329 Chronic sinusitis, unspecified: Secondary | ICD-10-CM

## 2014-04-02 DIAGNOSIS — I2699 Other pulmonary embolism without acute cor pulmonale: Secondary | ICD-10-CM

## 2014-04-02 DIAGNOSIS — R040 Epistaxis: Secondary | ICD-10-CM

## 2014-04-02 LAB — POCT CBC
Granulocyte percent: 81.6 %G — AB (ref 37–80)
HEMATOCRIT: 48.1 % (ref 43.5–53.7)
Hemoglobin: 15.5 g/dL (ref 14.1–18.1)
Lymph, poc: 16.9 — AB (ref 0.6–3.4)
MCH, POC: 28.8 pg (ref 27–31.2)
MCHC: 32.3 g/dL (ref 31.8–35.4)
MCV: 89.2 fL (ref 80–97)
MPV: 7.9 fL (ref 0–99.8)
PLATELET COUNT, POC: 313 10*3/uL (ref 142–424)
POC GRANULOCYTE: 11.9 — AB (ref 2–6.9)
POC LYMPH %: 16.9 % (ref 10–50)
RBC: 5.4 M/uL (ref 4.69–6.13)
RDW, POC: 13.2 %
WBC: 14.6 10*3/uL — AB (ref 4.6–10.2)

## 2014-04-02 LAB — POCT RAPID STREP A (OFFICE): Rapid Strep A Screen: NEGATIVE

## 2014-04-02 LAB — POCT INR: INR: 2.9

## 2014-04-02 MED ORDER — AMOXICILLIN 500 MG PO CAPS: 500.0000 mg | ORAL_CAPSULE | Freq: Three times a day (TID) | ORAL | Status: DC

## 2014-04-02 NOTE — Progress Notes (Signed)
Subjective:    Patient ID: Allen Watson, male    DOB: 07-Feb-1966, 49 y.o.   MRN: [id number]  HPI Patient here today for cold, cough and mainly nose bleeds that started last Wednesday. The worst nose bleed was this morning. The patient indicates that initially he was running some fever and had a sore throat. He is not had much of a cough and there is no sputum production. He does have a low-grade fever today. His INR is a little bit on the thin side today.         Patient Active Problem List   Diagnosis Date Noted  . Chest pain 07/04/2012  . Acute pulmonary embolism 06/22/2012   Outpatient Encounter Prescriptions as of 04/02/2014  Medication Sig  . calcium citrate-vitamin D (CITRACAL+D) 315-200 MG-UNIT per tablet Take 1 tablet by mouth 2 (two) times daily.  . Multiple Vitamin (MULTIVITAMIN WITH MINERALS) TABS tablet Take 1 tablet by mouth daily.  Marland Kitchen warfarin (COUMADIN) 5 MG tablet Take 2 to 3 tablets by mouth daily as directed by anticoagulation clinic    Review of Systems  Constitutional: Negative.   HENT: Positive for congestion, nosebleeds and sinus pressure.   Eyes: Negative.   Respiratory: Positive for cough.   Cardiovascular: Negative.   Gastrointestinal: Negative.   Endocrine: Negative.   Genitourinary: Negative.   Musculoskeletal: Negative.   Skin: Negative.   Allergic/Immunologic: Negative.   Neurological: Positive for headaches.  Hematological: Negative.   Psychiatric/Behavioral: Negative.        Objective:   Physical Exam  Constitutional: He is oriented to person, place, and time. He appears well-developed and well-nourished. No distress.  HENT:  Head: Normocephalic and atraumatic.  Right Ear: External ear normal.  Left Ear: External ear normal.  Mouth/Throat: Oropharynx is clear and moist. No oropharyngeal exudate.  The throat was red and slightly swollen posteriorly and there are no anterior cervical nodes. Nasal irritation left nasal septum with  eschar formation  Eyes: Conjunctivae and EOM are normal. Pupils are equal, round, and reactive to light. Right eye exhibits no discharge. Left eye exhibits no discharge. No scleral icterus.  Neck: Normal range of motion. Neck supple. No thyromegaly present.  Cardiovascular: Normal rate, regular rhythm and normal heart sounds.  Exam reveals no gallop and no friction rub.   No murmur heard. Pulmonary/Chest: Effort normal and breath sounds normal. No respiratory distress. He has no wheezes. He has no rales. He exhibits no tenderness.  Dry cough  Abdominal: He exhibits no mass.  Musculoskeletal: Normal range of motion.  Lymphadenopathy:    He has no cervical adenopathy.  Neurological: He is alert and oriented to person, place, and time. No cranial nerve deficit.  Skin: Skin is warm and dry. No rash noted.  Psychiatric: He has a normal mood and affect. His behavior is normal. Judgment and thought content normal.  Nursing note and vitals reviewed.  BP 135/86 mmHg  Pulse 86  Temp(Src) 99.3 F (37.4 C) (Oral)  Ht 6' (1.829 m)  Wt 224 lb (101.606 kg)  BMI 30.37 kg/m2  Results for orders placed or performed in visit on 04/02/14  POCT INR  Result Value Ref Range   INR 2.9   POCT CBC  Result Value Ref Range   WBC 14.6 (A) 4.6 - 10.2 K/uL   Lymph, poc 16.9 (A) 0.6 - 3.4   POC LYMPH PERCENT 16.9 10 - 50 %L   POC Granulocyte 11.9 (A) 2 - 6.9   Granulocyte percent 81.6 (  A) 37 - 80 %G   RBC 5.4 4.69 - 6.13 M/uL   Hemoglobin 15.5 14.1 - 18.1 g/dL   HCT, POC 65.748.1 84.643.5 - 53.7 %   MCV 89.2 80 - 97 fL   MCH, POC 28.8 27 - 31.2 pg   MCHC 32.3 31.8 - 35.4 g/dL   RDW, POC 96.213.2 %   Platelet Count, POC 313.0 142 - 424 K/uL   MPV 7.9 0 - 99.8 fL         Assessment & Plan:  1. Acute pulmonary embolism -Hold Coumadin tonight and only take one half dose tomorrow night and repeat pro time in 2 days - POCT INR  2. Bleeding from the nose -Hold Coumadin today and only take one half dose tomorrow  night and repeat pro time in 2 days - POCT CBC  3. Rhinosinusitis - amoxicillin (AMOXIL) 500 MG capsule; Take 1 capsule (500 mg total) by mouth 3 (three) times daily.  Dispense: 30 capsule; Refill: 0 -Use saline nose spray and saline gel as directed -Take Tylenol as needed for aches pains and fever -Use cool mist humidification and keep the house as cool as possible and drink plenty of fluids  4. Sore throat - amoxicillin (AMOXIL) 500 MG capsule; Take 1 capsule (500 mg total) by mouth 3 (three) times daily.  Dispense: 30 capsule; Refill: 0  Patient Instructions  Take antibiotic as directed Hold the Coumadin tonight and only take one half pill tomorrow night Return to clinic Wednesday morning for another pro time If further nosebleeds occur, you may need to go to the emergency room for cauterization. Hold Pressure with the thumb and index finger for at least 5 minutes Use a cool mist humidifier at home Use Mucinex maximum strength, blue and white in color, 1 twice daily for cough and congestion KeepThe house as cool as possible and drink plenty of cold fluids Use saline nose spray and gel as directed Remain out of work until pro time was checked on Wednesday   Nyra Capeson W. Moore MD

## 2014-04-02 NOTE — Patient Instructions (Signed)
Take antibiotic as directed Hold the Coumadin tonight and only take one half pill tomorrow night Return to clinic Wednesday morning for another pro time If further nosebleeds occur, you may need to go to the emergency room for cauterization. Hold Pressure with the thumb and index finger for at least 5 minutes Use a cool mist humidifier at home Use Mucinex maximum strength, blue and white in color, 1 twice daily for cough and congestion KeepThe house as cool as possible and drink plenty of cold fluids Use saline nose spray and gel as directed Remain out of work until pro time was checked on Wednesday

## 2014-04-04 ENCOUNTER — Ambulatory Visit (INDEPENDENT_AMBULATORY_CARE_PROVIDER_SITE_OTHER): Admitting: Pharmacist

## 2014-04-04 DIAGNOSIS — I2699 Other pulmonary embolism without acute cor pulmonale: Secondary | ICD-10-CM

## 2014-04-04 LAB — CULTURE, GROUP A STREP: Strep A Culture: NEGATIVE

## 2014-04-04 LAB — POCT INR: INR: 1.6

## 2014-04-30 ENCOUNTER — Ambulatory Visit (INDEPENDENT_AMBULATORY_CARE_PROVIDER_SITE_OTHER): Admitting: Pharmacist

## 2014-04-30 DIAGNOSIS — I2699 Other pulmonary embolism without acute cor pulmonale: Secondary | ICD-10-CM

## 2014-04-30 LAB — POCT INR: INR: 2.6

## 2014-04-30 NOTE — Patient Instructions (Signed)
Anticoagulation Dose Instructions as of 04/30/2014      Allen SmilesSun Mon Tue Wed Thu Fri Sat   New Dose 10 mg 15 mg 10 mg 10 mg 10 mg 15 mg 10 mg    Description        Continue warfarin 5mg  tablet - 2 tablets daily except 3 tablets on Mondays and Fridays      INR was 2.6 today

## 2014-05-21 ENCOUNTER — Encounter: Payer: Self-pay | Admitting: Nurse Practitioner

## 2014-05-21 ENCOUNTER — Ambulatory Visit (INDEPENDENT_AMBULATORY_CARE_PROVIDER_SITE_OTHER): Admitting: Nurse Practitioner

## 2014-05-21 DIAGNOSIS — Z23 Encounter for immunization: Secondary | ICD-10-CM

## 2014-05-21 DIAGNOSIS — I2699 Other pulmonary embolism without acute cor pulmonale: Secondary | ICD-10-CM | POA: Diagnosis not present

## 2014-05-21 LAB — POCT INR: INR: 2.9

## 2014-05-21 MED ORDER — WARFARIN SODIUM 5 MG PO TABS: ORAL_TABLET | ORAL | Status: DC

## 2014-05-21 NOTE — Progress Notes (Signed)
   Subjective:    Patient ID: Allen Watson, male    DOB: [birth year], 49 y.o.   MRN: [id number]  HPI Patient here today for follow up- has not had follow up in over a year- He has hx of PE and is on coumadin and needs INR checked today. He is doing well today without complaints.  Patient Active Problem List   Diagnosis Date Noted  . Chest pain 07/04/2012  . Acute pulmonary embolism 06/22/2012   Outpatient Encounter Prescriptions as of 05/21/2014  Medication Sig  . amoxicillin (AMOXIL) 500 MG capsule Take 1 capsule (500 mg total) by mouth 3 (three) times daily.  . calcium citrate-vitamin D (CITRACAL+D) 315-200 MG-UNIT per tablet Take 1 tablet by mouth 2 (two) times daily.  . Multiple Vitamin (MULTIVITAMIN WITH MINERALS) TABS tablet Take 1 tablet by mouth daily.  Marland Kitchen warfarin (COUMADIN) 5 MG tablet Take 2 to 3 tablets by mouth daily as directed by anticoagulation clinic        Review of Systems  Constitutional: Negative.   HENT: Negative.   Respiratory: Negative.   Cardiovascular: Negative.   Genitourinary: Negative.   Neurological: Negative.   Psychiatric/Behavioral: Negative.   All other systems reviewed and are negative.      Objective:   Physical Exam  Constitutional: He is oriented to person, place, and time. He appears well-developed and well-nourished.  HENT:  Head: Normocephalic.  Right Ear: External ear normal.  Left Ear: External ear normal.  Nose: Nose normal.  Mouth/Throat: Oropharynx is clear and moist.  Eyes: EOM are normal. Pupils are equal, round, and reactive to light.  Neck: Normal range of motion. Neck supple. No JVD present. No thyromegaly present.  Cardiovascular: Normal rate, regular rhythm, normal heart sounds and intact distal pulses.  Exam reveals no gallop and no friction rub.   No murmur heard. Pulmonary/Chest: Effort normal and breath sounds normal. No respiratory distress. He has no wheezes. He has no rales. He exhibits no tenderness.    Abdominal: Soft. Bowel sounds are normal. He exhibits no mass. There is no tenderness.  Musculoskeletal: Normal range of motion. He exhibits no edema.  Lymphadenopathy:    He has no cervical adenopathy.  Neurological: He is alert and oriented to person, place, and time. No cranial nerve deficit.  Skin: Skin is warm and dry.  Psychiatric: He has a normal mood and affect. His behavior is normal. Judgment and thought content normal.    BP 144/87 mmHg  Pulse 89  Temp(Src) 97.2 F (36.2 C) (Oral)  Ht 6' (1.829 m)  Wt 230 lb (104.327 kg)  BMI 31.19 kg/m2       Assessment & Plan:  1. Acute pulmonary embolism Continue coumadin as rx - POCT INR - CMP14+EGFR - NMR, lipoprofile - PSA, total and free   boostrix today Labs pending Health maintenance reviewed Diet and exercise encouraged Continue all meds Follow up  In 6 months   Gem, FNP

## 2014-05-21 NOTE — Addendum Note (Signed)
Addended by: Cleda DaubUCKER, Meaghann Choo G on: 05/21/2014 11:34 AM   Modules accepted: Orders

## 2014-05-22 LAB — CMP14+EGFR
ALT: 16 IU/L (ref 0–44)
AST: 24 IU/L (ref 0–40)
Albumin/Globulin Ratio: 1.8 (ref 1.1–2.5)
Albumin: 4.4 g/dL (ref 3.5–5.5)
Alkaline Phosphatase: 71 IU/L (ref 39–117)
BUN/Creatinine Ratio: 19 (ref 9–20)
BUN: 15 mg/dL (ref 6–24)
Bilirubin Total: 0.3 mg/dL (ref 0.0–1.2)
CALCIUM: 9.3 mg/dL (ref 8.7–10.2)
CHLORIDE: 104 mmol/L (ref 97–108)
CO2: 23 mmol/L (ref 18–29)
Creatinine, Ser: 0.81 mg/dL (ref 0.76–1.27)
GFR calc Af Amer: 121 mL/min/{1.73_m2} (ref >59)
GFR, EST NON AFRICAN AMERICAN: 105 mL/min/{1.73_m2} (ref >59)
GLUCOSE: 101 mg/dL — AB (ref 65–99)
Globulin, Total: 2.5 g/dL (ref 1.5–4.5)
POTASSIUM: 3.9 mmol/L (ref 3.5–5.2)
Sodium: 141 mmol/L (ref 134–144)
Total Protein: 6.9 g/dL (ref 6.0–8.5)

## 2014-05-22 LAB — NMR, LIPOPROFILE
CHOLESTEROL: 251 mg/dL — AB (ref 100–199)
HDL Cholesterol by NMR: 78 mg/dL (ref >39)
HDL PARTICLE NUMBER: 42.4 umol/L (ref >=30.5)
LDL Particle Number: 1251 nmol/L — ABNORMAL HIGH (ref <1000)
LDL Size: 21.8 nm (ref >20.5)
LDL-C: 152 mg/dL — AB (ref 0–99)
LP-IR Score: <25 (ref <=45)
Small LDL Particle Number: 265 nmol/L (ref <=527)
Triglycerides by NMR: 103 mg/dL (ref 0–149)

## 2014-05-22 LAB — PSA, TOTAL AND FREE
PSA FREE PCT: 20.9 %
PSA, Free: 0.23 ng/mL
PSA: 1.1 ng/mL (ref 0.0–4.0)

## 2014-06-11 ENCOUNTER — Ambulatory Visit (INDEPENDENT_AMBULATORY_CARE_PROVIDER_SITE_OTHER): Admitting: Pharmacist

## 2014-06-11 DIAGNOSIS — I2699 Other pulmonary embolism without acute cor pulmonale: Secondary | ICD-10-CM | POA: Diagnosis not present

## 2014-06-11 LAB — POCT INR: INR: 2.3

## 2014-06-11 NOTE — Patient Instructions (Signed)
Anticoagulation Dose Instructions as of 06/11/2014      Allen Watson Mon Tue Wed Thu Fri Sat   New Dose 10 mg 15 mg 10 mg 10 mg 10 mg 15 mg 10 mg    Description        Continue warfarin 5mg  tablet - 2 tablets daily except 3 tablets on Mondays and Fridays     INR was 2.3 today

## 2014-07-16 ENCOUNTER — Ambulatory Visit (INDEPENDENT_AMBULATORY_CARE_PROVIDER_SITE_OTHER): Admitting: Pharmacist

## 2014-07-16 DIAGNOSIS — Z7901 Long term (current) use of anticoagulants: Secondary | ICD-10-CM

## 2014-07-16 DIAGNOSIS — I2699 Other pulmonary embolism without acute cor pulmonale: Secondary | ICD-10-CM | POA: Diagnosis not present

## 2014-07-16 LAB — POCT INR: INR: 2.5

## 2014-08-20 ENCOUNTER — Ambulatory Visit (INDEPENDENT_AMBULATORY_CARE_PROVIDER_SITE_OTHER): Admitting: Pharmacist

## 2014-08-20 DIAGNOSIS — I2699 Other pulmonary embolism without acute cor pulmonale: Secondary | ICD-10-CM

## 2014-08-20 LAB — POCT INR: INR: 2.5

## 2014-09-20 ENCOUNTER — Ambulatory Visit (INDEPENDENT_AMBULATORY_CARE_PROVIDER_SITE_OTHER): Admitting: Pharmacist

## 2014-09-20 DIAGNOSIS — I2699 Other pulmonary embolism without acute cor pulmonale: Secondary | ICD-10-CM

## 2014-09-20 LAB — POCT INR: INR: 3

## 2014-10-22 ENCOUNTER — Ambulatory Visit (INDEPENDENT_AMBULATORY_CARE_PROVIDER_SITE_OTHER): Admitting: Pharmacist

## 2014-10-22 DIAGNOSIS — I2699 Other pulmonary embolism without acute cor pulmonale: Secondary | ICD-10-CM | POA: Diagnosis not present

## 2014-10-22 LAB — POCT INR: INR: 2.9

## 2014-11-26 ENCOUNTER — Ambulatory Visit (INDEPENDENT_AMBULATORY_CARE_PROVIDER_SITE_OTHER): Admitting: Pharmacist

## 2014-11-26 DIAGNOSIS — I2699 Other pulmonary embolism without acute cor pulmonale: Secondary | ICD-10-CM

## 2014-11-26 LAB — POCT INR: INR: 3

## 2014-11-26 NOTE — Patient Instructions (Signed)
Anticoagulation Dose Instructions as of 11/26/2014      Glynis Smiles Tue Wed Thu Fri Sat   New Dose 10 mg 15 mg 10 mg 10 mg 10 mg 15 mg 10 mg    Description        Continue warfarin  tablet - 2 tablets daily except 3 tablets on Mondays and Fridays     INR was 3.0 today

## 2014-12-24 ENCOUNTER — Encounter: Payer: Self-pay | Admitting: Pharmacist

## 2014-12-28 ENCOUNTER — Ambulatory Visit (INDEPENDENT_AMBULATORY_CARE_PROVIDER_SITE_OTHER): Admitting: Pharmacist

## 2014-12-28 DIAGNOSIS — I2609 Other pulmonary embolism with acute cor pulmonale: Secondary | ICD-10-CM | POA: Diagnosis not present

## 2014-12-28 DIAGNOSIS — Z23 Encounter for immunization: Secondary | ICD-10-CM

## 2014-12-28 DIAGNOSIS — I2699 Other pulmonary embolism without acute cor pulmonale: Secondary | ICD-10-CM

## 2014-12-28 LAB — POCT INR: INR: 2.3

## 2014-12-28 NOTE — Patient Instructions (Signed)
Anticoagulation Dose Instructions as of 12/28/2014      Allen SmilesSun Mon Tue Wed Thu Fri Sat   New Dose 10 mg 15 mg 10 mg 10 mg 10 mg 15 mg 10 mg    Description        Continue warfarin 5mg  tablet - 2 tablets daily except 3 tablets on Mondays and Fridays

## 2015-01-30 ENCOUNTER — Encounter: Payer: Self-pay | Admitting: Pharmacist

## 2015-01-30 ENCOUNTER — Ambulatory Visit (INDEPENDENT_AMBULATORY_CARE_PROVIDER_SITE_OTHER): Admitting: Pharmacist

## 2015-01-30 DIAGNOSIS — I2699 Other pulmonary embolism without acute cor pulmonale: Secondary | ICD-10-CM

## 2015-01-30 LAB — POCT INR: INR: 3.4

## 2015-01-30 NOTE — Patient Instructions (Signed)
Anticoagulation Dose Instructions as of 01/30/2015      Allen SmilesSun Mon Tue Wed Thu Fri Sat   New Dose 10 mg 15 mg 10 mg 10 mg 10 mg 15 mg 10 mg    Description        Hold warfarin dose today Wednesday Nov. 23, 2016. Continue warfarin 5mg  tablet - 2 tablets daily except 3 tablets on Mondays and Fridays.      INR today was 3.4

## 2015-01-30 NOTE — Addendum Note (Signed)
Addended by: Henrene PastorECKARD, Tyreona Panjwani on: 01/30/2015 08:58 AM   Modules accepted: Level of Service

## 2015-02-17 ENCOUNTER — Other Ambulatory Visit: Payer: Self-pay | Admitting: Nurse Practitioner

## 2015-03-01 ENCOUNTER — Ambulatory Visit (INDEPENDENT_AMBULATORY_CARE_PROVIDER_SITE_OTHER): Admitting: Pharmacist

## 2015-03-01 DIAGNOSIS — I2690 Septic pulmonary embolism without acute cor pulmonale: Secondary | ICD-10-CM

## 2015-03-01 DIAGNOSIS — I2699 Other pulmonary embolism without acute cor pulmonale: Secondary | ICD-10-CM | POA: Diagnosis not present

## 2015-03-01 LAB — POCT INR: INR: 2.6

## 2015-04-01 ENCOUNTER — Ambulatory Visit (INDEPENDENT_AMBULATORY_CARE_PROVIDER_SITE_OTHER): Admitting: Pharmacist

## 2015-04-01 DIAGNOSIS — I2699 Other pulmonary embolism without acute cor pulmonale: Secondary | ICD-10-CM | POA: Diagnosis not present

## 2015-04-01 DIAGNOSIS — Z7901 Long term (current) use of anticoagulants: Secondary | ICD-10-CM | POA: Diagnosis not present

## 2015-04-01 DIAGNOSIS — Z86711 Personal history of pulmonary embolism: Secondary | ICD-10-CM | POA: Diagnosis not present

## 2015-04-01 DIAGNOSIS — I2690 Septic pulmonary embolism without acute cor pulmonale: Secondary | ICD-10-CM

## 2015-04-01 LAB — POCT INR: INR: 2.6

## 2015-04-01 NOTE — Patient Instructions (Signed)
Anticoagulation Dose Instructions as of 04/01/2015      Allen Watson Tue Wed Thu Fri Sat   New Dose 10 mg 15 mg 10 mg 10 mg 10 mg 15 mg 10 mg    Description         Continue warfarin  tablet - 2 tablets daily except 3 tablets on Mondays and Fridays.     INR was 2.6 today

## 2015-04-29 ENCOUNTER — Ambulatory Visit (INDEPENDENT_AMBULATORY_CARE_PROVIDER_SITE_OTHER): Admitting: Pharmacist

## 2015-04-29 DIAGNOSIS — Z7901 Long term (current) use of anticoagulants: Secondary | ICD-10-CM | POA: Diagnosis not present

## 2015-04-29 DIAGNOSIS — Z86711 Personal history of pulmonary embolism: Secondary | ICD-10-CM

## 2015-04-29 DIAGNOSIS — I2699 Other pulmonary embolism without acute cor pulmonale: Secondary | ICD-10-CM

## 2015-04-29 DIAGNOSIS — I2690 Septic pulmonary embolism without acute cor pulmonale: Secondary | ICD-10-CM

## 2015-04-29 LAB — POCT INR: INR: 2.9

## 2015-05-27 ENCOUNTER — Ambulatory Visit: Payer: Self-pay | Admitting: Nurse Practitioner

## 2015-05-31 ENCOUNTER — Ambulatory Visit (INDEPENDENT_AMBULATORY_CARE_PROVIDER_SITE_OTHER): Admitting: Nurse Practitioner

## 2015-05-31 ENCOUNTER — Encounter: Payer: Self-pay | Admitting: Nurse Practitioner

## 2015-05-31 VITALS — BP 120/80 | HR 75 | Temp 98.6°F | Ht 72.0 in | Wt 244.0 lb

## 2015-05-31 DIAGNOSIS — I2690 Septic pulmonary embolism without acute cor pulmonale: Secondary | ICD-10-CM | POA: Diagnosis not present

## 2015-05-31 DIAGNOSIS — Z86711 Personal history of pulmonary embolism: Secondary | ICD-10-CM

## 2015-05-31 LAB — COAGUCHEK XS/INR WAIVED
INR: 2.5 — AB (ref 0.9–1.1)
Prothrombin Time: 30.9 s

## 2015-05-31 LAB — PROTIME-INR: INR: 2.6 — AB (ref <=1.1)

## 2015-05-31 NOTE — Progress Notes (Addendum)
Patient ID: Allen MaladyWilliam Watson, male   DOB: 11/10/1965, 50 y.o.   MRN: [id number]  PAtient here today for INR only- see INR documentation- done 05/31/15

## 2015-06-07 ENCOUNTER — Other Ambulatory Visit: Payer: Self-pay | Admitting: Nurse Practitioner

## 2015-07-01 ENCOUNTER — Ambulatory Visit (INDEPENDENT_AMBULATORY_CARE_PROVIDER_SITE_OTHER): Admitting: Pharmacist

## 2015-07-01 DIAGNOSIS — I2690 Septic pulmonary embolism without acute cor pulmonale: Secondary | ICD-10-CM | POA: Diagnosis not present

## 2015-07-01 DIAGNOSIS — Z86711 Personal history of pulmonary embolism: Secondary | ICD-10-CM | POA: Diagnosis not present

## 2015-07-01 LAB — COAGUCHEK XS/INR WAIVED
INR: 2.4 — AB (ref 0.9–1.1)
PROTHROMBIN TIME: 28.9 s

## 2015-07-29 ENCOUNTER — Ambulatory Visit (INDEPENDENT_AMBULATORY_CARE_PROVIDER_SITE_OTHER): Admitting: Pharmacist

## 2015-07-29 DIAGNOSIS — Z86711 Personal history of pulmonary embolism: Secondary | ICD-10-CM

## 2015-07-29 DIAGNOSIS — I2690 Septic pulmonary embolism without acute cor pulmonale: Secondary | ICD-10-CM

## 2015-07-29 LAB — COAGUCHEK XS/INR WAIVED
INR: 3.1 — ABNORMAL HIGH (ref 0.9–1.1)
Prothrombin Time: 37 s

## 2015-07-29 NOTE — Patient Instructions (Signed)
Anticoagulation Dose Instructions as of 07/29/2015      Allen SmilesSun Mon Tue Wed Thu Fri Sat   New Dose 10 mg 15 mg 10 mg 10 mg 10 mg 15 mg 10 mg    Description        Take 2 instead of 3 tablets today.  Then continue warfarin 5mg  tablet - 2 tablets daily except 3 tablets on Mondays and Fridays.     INR was 3.1 today

## 2015-08-26 ENCOUNTER — Ambulatory Visit (INDEPENDENT_AMBULATORY_CARE_PROVIDER_SITE_OTHER): Admitting: Pharmacist

## 2015-08-26 DIAGNOSIS — I2690 Septic pulmonary embolism without acute cor pulmonale: Secondary | ICD-10-CM

## 2015-08-26 DIAGNOSIS — Z86711 Personal history of pulmonary embolism: Secondary | ICD-10-CM | POA: Diagnosis not present

## 2015-08-26 LAB — COAGUCHEK XS/INR WAIVED
INR: 2.4 — ABNORMAL HIGH (ref 0.9–1.1)
Prothrombin Time: 28.7 s

## 2015-09-12 ENCOUNTER — Other Ambulatory Visit: Payer: Self-pay | Admitting: Nurse Practitioner

## 2015-09-30 ENCOUNTER — Ambulatory Visit (INDEPENDENT_AMBULATORY_CARE_PROVIDER_SITE_OTHER): Admitting: Pharmacist

## 2015-09-30 DIAGNOSIS — Z86711 Personal history of pulmonary embolism: Secondary | ICD-10-CM

## 2015-09-30 DIAGNOSIS — I2699 Other pulmonary embolism without acute cor pulmonale: Secondary | ICD-10-CM | POA: Diagnosis not present

## 2015-09-30 LAB — COAGUCHEK XS/INR WAIVED
INR: 2.5 — ABNORMAL HIGH (ref 0.9–1.1)
Prothrombin Time: 30.5 s

## 2015-09-30 NOTE — Patient Instructions (Signed)
Anticoagulation Dose Instructions as of 09/30/2015      Allen Watson Tue Wed Thu Fri Sat   New Dose 10 mg 15 mg 10 mg 10 mg 10 mg 15 mg 10 mg    Description   Take 2 instead of 3 tablets today.  Then continue warfarin 5mg  tablet - 2 tablets daily except 3 tablets on Mondays and Fridays.    INR was 2.5 today

## 2015-10-28 ENCOUNTER — Ambulatory Visit (INDEPENDENT_AMBULATORY_CARE_PROVIDER_SITE_OTHER): Admitting: Pharmacist

## 2015-10-28 DIAGNOSIS — I2699 Other pulmonary embolism without acute cor pulmonale: Secondary | ICD-10-CM

## 2015-10-28 DIAGNOSIS — Z86711 Personal history of pulmonary embolism: Secondary | ICD-10-CM

## 2015-10-28 DIAGNOSIS — Z7901 Long term (current) use of anticoagulants: Secondary | ICD-10-CM | POA: Diagnosis not present

## 2015-10-28 LAB — COAGUCHEK XS/INR WAIVED
INR: 2.3 — ABNORMAL HIGH (ref 0.9–1.1)
PROTHROMBIN TIME: 27.4 s

## 2015-11-18 ENCOUNTER — Other Ambulatory Visit: Payer: Self-pay | Admitting: Nurse Practitioner

## 2015-12-02 ENCOUNTER — Ambulatory Visit (INDEPENDENT_AMBULATORY_CARE_PROVIDER_SITE_OTHER): Admitting: Pharmacist

## 2015-12-02 DIAGNOSIS — Z7901 Long term (current) use of anticoagulants: Secondary | ICD-10-CM

## 2015-12-02 DIAGNOSIS — Z86711 Personal history of pulmonary embolism: Secondary | ICD-10-CM | POA: Diagnosis not present

## 2015-12-02 DIAGNOSIS — I2699 Other pulmonary embolism without acute cor pulmonale: Secondary | ICD-10-CM

## 2015-12-02 DIAGNOSIS — Z23 Encounter for immunization: Secondary | ICD-10-CM

## 2015-12-02 LAB — COAGUCHEK XS/INR WAIVED
INR: 2.5 — AB (ref 0.9–1.1)
PROTHROMBIN TIME: 30.6 s

## 2016-01-06 ENCOUNTER — Ambulatory Visit (INDEPENDENT_AMBULATORY_CARE_PROVIDER_SITE_OTHER): Admitting: Pharmacist

## 2016-01-06 DIAGNOSIS — Z86711 Personal history of pulmonary embolism: Secondary | ICD-10-CM | POA: Diagnosis not present

## 2016-01-06 DIAGNOSIS — Z7901 Long term (current) use of anticoagulants: Secondary | ICD-10-CM

## 2016-01-06 DIAGNOSIS — I2699 Other pulmonary embolism without acute cor pulmonale: Secondary | ICD-10-CM | POA: Diagnosis not present

## 2016-01-06 LAB — COAGUCHEK XS/INR WAIVED
INR: 2.5 — AB (ref 0.9–1.1)
Prothrombin Time: 30.1 s

## 2016-01-27 ENCOUNTER — Ambulatory Visit (INDEPENDENT_AMBULATORY_CARE_PROVIDER_SITE_OTHER): Admitting: Pharmacist

## 2016-01-27 DIAGNOSIS — I2699 Other pulmonary embolism without acute cor pulmonale: Secondary | ICD-10-CM

## 2016-01-27 DIAGNOSIS — Z86711 Personal history of pulmonary embolism: Secondary | ICD-10-CM | POA: Diagnosis not present

## 2016-01-27 DIAGNOSIS — Z7901 Long term (current) use of anticoagulants: Secondary | ICD-10-CM | POA: Diagnosis not present

## 2016-01-27 LAB — COAGUCHEK XS/INR WAIVED
INR: 3.2 — ABNORMAL HIGH (ref 0.9–1.1)
Prothrombin Time: 38.3 s

## 2016-02-24 ENCOUNTER — Ambulatory Visit (INDEPENDENT_AMBULATORY_CARE_PROVIDER_SITE_OTHER): Admitting: Pharmacist

## 2016-02-24 DIAGNOSIS — Z86711 Personal history of pulmonary embolism: Secondary | ICD-10-CM

## 2016-02-24 DIAGNOSIS — Z7901 Long term (current) use of anticoagulants: Secondary | ICD-10-CM | POA: Diagnosis not present

## 2016-02-24 LAB — COAGUCHEK XS/INR WAIVED
INR: 3.2 — AB (ref 0.9–1.1)
PROTHROMBIN TIME: 38.9 s

## 2016-03-10 ENCOUNTER — Other Ambulatory Visit: Payer: Self-pay | Admitting: Nurse Practitioner

## 2016-03-16 ENCOUNTER — Encounter: Payer: Self-pay | Admitting: Pharmacist

## 2016-03-23 ENCOUNTER — Ambulatory Visit (INDEPENDENT_AMBULATORY_CARE_PROVIDER_SITE_OTHER): Admitting: Pharmacist

## 2016-03-23 DIAGNOSIS — Z86711 Personal history of pulmonary embolism: Secondary | ICD-10-CM | POA: Diagnosis not present

## 2016-03-23 DIAGNOSIS — Z7901 Long term (current) use of anticoagulants: Secondary | ICD-10-CM

## 2016-03-23 LAB — COAGUCHEK XS/INR WAIVED
INR: 2.3 — AB (ref 0.9–1.1)
PROTHROMBIN TIME: 28 s

## 2016-04-20 ENCOUNTER — Ambulatory Visit (INDEPENDENT_AMBULATORY_CARE_PROVIDER_SITE_OTHER): Admitting: Pharmacist

## 2016-04-20 DIAGNOSIS — Z86711 Personal history of pulmonary embolism: Secondary | ICD-10-CM

## 2016-04-20 DIAGNOSIS — Z7901 Long term (current) use of anticoagulants: Secondary | ICD-10-CM | POA: Diagnosis not present

## 2016-04-20 LAB — COAGUCHEK XS/INR WAIVED
INR: 2 — ABNORMAL HIGH (ref 0.9–1.1)
PROTHROMBIN TIME: 23.8 s

## 2016-05-22 ENCOUNTER — Encounter: Payer: Self-pay | Admitting: Nurse Practitioner

## 2016-05-22 ENCOUNTER — Encounter: Payer: Self-pay | Admitting: Physician Assistant

## 2016-05-22 ENCOUNTER — Other Ambulatory Visit: Payer: Self-pay | Admitting: *Deleted

## 2016-05-22 ENCOUNTER — Ambulatory Visit (INDEPENDENT_AMBULATORY_CARE_PROVIDER_SITE_OTHER): Admitting: Nurse Practitioner

## 2016-05-22 VITALS — BP 134/87 | HR 60 | Temp 96.8°F | Ht 72.0 in | Wt 206.0 lb

## 2016-05-22 DIAGNOSIS — Z0000 Encounter for general adult medical examination without abnormal findings: Secondary | ICD-10-CM | POA: Diagnosis not present

## 2016-05-22 DIAGNOSIS — Z7901 Long term (current) use of anticoagulants: Secondary | ICD-10-CM

## 2016-05-22 DIAGNOSIS — Z86711 Personal history of pulmonary embolism: Secondary | ICD-10-CM

## 2016-05-22 DIAGNOSIS — Z1211 Encounter for screening for malignant neoplasm of colon: Secondary | ICD-10-CM

## 2016-05-22 LAB — COAGUCHEK XS/INR WAIVED
INR: 2.1 — ABNORMAL HIGH (ref 0.9–1.1)
PROTHROMBIN TIME: 24.9 s

## 2016-05-22 MED ORDER — WARFARIN SODIUM 5 MG PO TABS: ORAL_TABLET | ORAL | 1 refills | Status: DC

## 2016-05-22 NOTE — Patient Instructions (Signed)
 Health Maintenance, Male A healthy lifestyle and preventive care is important for your health and wellness. Ask your health care provider about what schedule of regular examinations is right for you. What should I know about weight and diet?  Eat a Healthy Diet  Eat plenty of vegetables, fruits, whole grains, low-fat dairy products, and lean protein.  Do not eat a lot of foods high in solid fats, added sugars, or salt. Maintain a Healthy Weight  Regular exercise can help you achieve or maintain a healthy weight. You should:  Do at least 150 minutes of exercise each week. The exercise should increase your heart rate and make you sweat (moderate-intensity exercise).  Do strength-training exercises at least twice a week. Watch Your Levels of Cholesterol and Blood Lipids  Have your blood tested for lipids and cholesterol every 5 years starting at 50 years of age. If you are at high risk for heart disease, you should start having your blood tested when you are 50 years old. You may need to have your cholesterol levels checked more often if:  Your lipid or cholesterol levels are high.  You are older than 50 years of age.  You are at high risk for heart disease. What should I know about cancer screening? Many types of cancers can be detected early and may often be prevented. Lung Cancer  You should be screened every year for lung cancer if:  You are a current smoker who has smoked for at least 30 years.  You are a former smoker who has quit within the past 15 years.  Talk to your health care provider about your screening options, when you should start screening, and how often you should be screened. Colorectal Cancer  Routine colorectal cancer screening usually begins at 50 years of age and should be repeated every 5-10 years until you are 50 years old. You may need to be screened more often if early forms of precancerous polyps or small growths are found. Your health care provider  may recommend screening at an earlier age if you have risk factors for colon cancer.  Your health care provider may recommend using home test kits to check for hidden blood in the stool.  A small camera at the end of a tube can be used to examine your colon (sigmoidoscopy or colonoscopy). This checks for the earliest forms of colorectal cancer. Prostate and Testicular Cancer  Depending on your age and overall health, your health care provider may do certain tests to screen for prostate and testicular cancer.  Talk to your health care provider about any symptoms or concerns you have about testicular or prostate cancer. Skin Cancer  Check your skin from head to toe regularly.  Tell your health care provider about any new moles or changes in moles, especially if:  There is a change in a mole's size, shape, or color.  You have a mole that is larger than a pencil eraser.  Always use sunscreen. Apply sunscreen liberally and repeat throughout the day.  Protect yourself by wearing long sleeves, pants, a wide-brimmed hat, and sunglasses when outside. What should I know about heart disease, diabetes, and high blood pressure?  If you are 18-39 years of age, have your blood pressure checked every 3-5 years. If you are 40 years of age or older, have your blood pressure checked every year. You should have your blood pressure measured twice-once when you are at a hospital or clinic, and once when you are not at   a hospital or clinic. Record the average of the two measurements. To check your blood pressure when you are not at a hospital or clinic, you can use:  An automated blood pressure machine at a pharmacy.  A home blood pressure monitor.  Talk to your health care provider about your target blood pressure.  If you are between 45-79 years old, ask your health care provider if you should take aspirin to prevent heart disease.  Have regular diabetes screenings by checking your fasting blood sugar  level.  If you are at a normal weight and have a low risk for diabetes, have this test once every three years after the age of 45.  If you are overweight and have a high risk for diabetes, consider being tested at a younger age or more often.  A one-time screening for abdominal aortic aneurysm (AAA) by ultrasound is recommended for men aged 65-75 years who are current or former smokers. What should I know about preventing infection? Hepatitis B  If you have a higher risk for hepatitis B, you should be screened for this virus. Talk with your health care provider to find out if you are at risk for hepatitis B infection. Hepatitis C  Blood testing is recommended for:  Everyone born from 1945 through 1965.  Anyone with known risk factors for hepatitis C. Sexually Transmitted Diseases (STDs)  You should be screened each year for STDs including gonorrhea and chlamydia if:  You are sexually active and are younger than 50 years of age.  You are older than 50 years of age and your health care provider tells you that you are at risk for this type of infection.  Your sexual activity has changed since you were last screened and you are at an increased risk for chlamydia or gonorrhea. Ask your health care provider if you are at risk.  Talk with your health care provider about whether you are at high risk of being infected with HIV. Your health care provider may recommend a prescription medicine to help prevent HIV infection. What else can I do?  Schedule regular health, dental, and eye exams.  Stay current with your vaccines (immunizations).  Do not use any tobacco products, such as cigarettes, chewing tobacco, and e-cigarettes. If you need help quitting, ask your health care provider.  Limit alcohol intake to no more than 2 drinks per day. One drink equals 12 ounces of beer, 5 ounces of wine, or 1 ounces of hard liquor.  Do not use street drugs.  Do not share needles.  Ask your health  care provider for help if you need support or information about quitting drugs.  Tell your health care provider if you often feel depressed.  Tell your health care provider if you have ever been abused or do not feel safe at home. This information is not intended to replace advice given to you by your health care provider. Make sure you discuss any questions you have with your health care provider. Document Released: 08/22/2007 Document Revised: 10/23/2015 Document Reviewed: 11/27/2014 Elsevier Interactive Patient Education  2017 Elsevier Inc.  

## 2016-05-22 NOTE — Addendum Note (Signed)
Addended by: Prescott GumLAND, Yarielis Funaro M on: 05/22/2016 09:09 AM   Modules accepted: Orders

## 2016-05-22 NOTE — Progress Notes (Signed)
   Subjective:    Patient ID: Allen Watson, male    DOB: 1965-07-15, 51 y.o.   MRN: [id number]  HPI  Patient in today for annual physical exam. He has history of PE and is still on coumadin and wants  To remain on that. He is doing well today without complaints. He has been dieting and exercising and has actually lost 35lbs since last visit.    Review of Systems  Constitutional: Negative for diaphoresis.  Eyes: Negative for pain.  Respiratory: Negative for shortness of breath.   Cardiovascular: Negative for chest pain, palpitations and leg swelling.  Gastrointestinal: Negative for abdominal pain.  Endocrine: Negative for polydipsia.  Skin: Negative for rash.  Neurological: Negative for dizziness, weakness and headaches.  Hematological: Does not bruise/bleed easily.       Objective:   Physical Exam  Constitutional: He is oriented to person, place, and time. He appears well-developed and well-nourished.  HENT:  Head: Normocephalic.  Right Ear: External ear normal.  Left Ear: External ear normal.  Nose: Nose normal.  Mouth/Throat: Oropharynx is clear and moist.  Eyes: EOM are normal. Pupils are equal, round, and reactive to light.  Neck: Normal range of motion. Neck supple. No JVD present. No thyromegaly present.  Cardiovascular: Normal rate, regular rhythm, normal heart sounds and intact distal pulses.  Exam reveals no gallop and no friction rub.   No murmur heard. Pulmonary/Chest: Effort normal and breath sounds normal. No respiratory distress. He has no wheezes. He has no rales. He exhibits no tenderness.  Abdominal: Soft. Bowel sounds are normal. He exhibits no mass. There is no tenderness.  Genitourinary: Prostate normal and penis normal.  Musculoskeletal: Normal range of motion. He exhibits no edema.  Lymphadenopathy:    He has no cervical adenopathy.  Neurological: He is alert and oriented to person, place, and time. No cranial nerve deficit.  Skin: Skin is warm and dry.    Psychiatric: He has a normal mood and affect. His behavior is normal. Judgment and thought content normal.   BP 134/87   Pulse 60   Temp (!) 96.8 F (36 C) (Oral)   Ht 6' (1.829 m)   Wt 206 lb (93.4 kg)   BMI 27.94 kg/m      Assessment & Plan:  1. Annual physical exam Continue diet and exercise  2. Chronic anticoagulation continue current dose of coumadin and recheck INR in 4 weeks  3. History of pulmonary embolus (PE)   Orders Placed This Encounter  Procedures  . CBC with Differential/Platelet  . CMP14+EGFR  . Lipid panel  . PSA, total and free  . Ambulatory referral to Gastroenterology    Referral Priority:   Routine    Referral Type:   Consultation    Referral Reason:   Specialty Services Required    Referred to Provider:   Gatha Mayer, MD    Number of Visits Requested:   1   Labs pending Health maintenance reviewed Diet and exercise encouraged Continue all meds Follow up  In 1 year   [name] Hassell Done, FNP

## 2016-05-23 LAB — CMP14+EGFR
A/G RATIO: 1.5 (ref 1.2–2.2)
ALT: 17 IU/L (ref 0–44)
AST: 22 IU/L (ref 0–40)
Albumin: 4.1 g/dL (ref 3.5–5.5)
Alkaline Phosphatase: 63 IU/L (ref 39–117)
BUN/Creatinine Ratio: 14 (ref 9–20)
BUN: 13 mg/dL (ref 6–24)
Bilirubin Total: 0.4 mg/dL (ref 0.0–1.2)
CALCIUM: 9.5 mg/dL (ref 8.7–10.2)
CO2: 25 mmol/L (ref 18–29)
Chloride: 105 mmol/L (ref 96–106)
Creatinine, Ser: 0.92 mg/dL (ref 0.76–1.27)
GFR, EST AFRICAN AMERICAN: 112 mL/min/{1.73_m2} (ref >59)
GFR, EST NON AFRICAN AMERICAN: 97 mL/min/{1.73_m2} (ref >59)
GLOBULIN, TOTAL: 2.8 g/dL (ref 1.5–4.5)
Glucose: 89 mg/dL (ref 65–99)
POTASSIUM: 4.7 mmol/L (ref 3.5–5.2)
Sodium: 144 mmol/L (ref 134–144)
TOTAL PROTEIN: 6.9 g/dL (ref 6.0–8.5)

## 2016-05-23 LAB — CBC WITH DIFFERENTIAL/PLATELET
BASOS: 2 %
Basophils Absolute: 0.1 10*3/uL (ref 0.0–0.2)
EOS (ABSOLUTE): 0.2 10*3/uL (ref 0.0–0.4)
EOS: 3 %
HEMATOCRIT: 43.2 % (ref 37.5–51.0)
Hemoglobin: 14.5 g/dL (ref 13.0–17.7)
IMMATURE GRANS (ABS): 0.1 10*3/uL (ref 0.0–0.1)
IMMATURE GRANULOCYTES: 1 %
LYMPHS: 32 %
Lymphocytes Absolute: 2.4 10*3/uL (ref 0.7–3.1)
MCH: 30.1 pg (ref 26.6–33.0)
MCHC: 33.6 g/dL (ref 31.5–35.7)
MCV: 90 fL (ref 79–97)
MONOCYTES: 11 %
Monocytes Absolute: 0.8 10*3/uL (ref 0.1–0.9)
NEUTROS PCT: 51 %
Neutrophils Absolute: 3.8 10*3/uL (ref 1.4–7.0)
PLATELETS: 287 10*3/uL (ref 150–379)
RBC: 4.82 x10E6/uL (ref 4.14–5.80)
RDW: 14.3 % (ref 12.3–15.4)
WBC: 7.4 10*3/uL (ref 3.4–10.8)

## 2016-05-23 LAB — PSA, TOTAL AND FREE
PSA, Free Pct: 24.4 %
PSA, Free: 0.22 ng/mL
Prostate Specific Ag, Serum: 0.9 ng/mL (ref 0.0–4.0)

## 2016-05-23 LAB — LIPID PANEL
CHOL/HDL RATIO: 2.9 ratio (ref 0.0–5.0)
Cholesterol, Total: 212 mg/dL — ABNORMAL HIGH (ref 100–199)
HDL: 74 mg/dL (ref >39)
LDL CALC: 109 mg/dL — AB (ref 0–99)
Triglycerides: 145 mg/dL (ref 0–149)
VLDL CHOLESTEROL CAL: 29 mg/dL (ref 5–40)

## 2016-05-28 ENCOUNTER — Ambulatory Visit (INDEPENDENT_AMBULATORY_CARE_PROVIDER_SITE_OTHER): Admitting: Physician Assistant

## 2016-05-28 ENCOUNTER — Encounter: Payer: Self-pay | Admitting: Physician Assistant

## 2016-05-28 VITALS — BP 104/80 | HR 80 | Ht 72.0 in | Wt 208.4 lb

## 2016-05-28 DIAGNOSIS — Z7901 Long term (current) use of anticoagulants: Secondary | ICD-10-CM | POA: Diagnosis not present

## 2016-05-28 DIAGNOSIS — Z1212 Encounter for screening for malignant neoplasm of rectum: Secondary | ICD-10-CM

## 2016-05-28 DIAGNOSIS — Z1211 Encounter for screening for malignant neoplasm of colon: Secondary | ICD-10-CM

## 2016-05-28 DIAGNOSIS — Z01818 Encounter for other preprocedural examination: Secondary | ICD-10-CM

## 2016-05-28 NOTE — Progress Notes (Addendum)
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 Coming off of his blood thinner for any reason would make him have to go back through certification from the [employer] to continue flying. He does ask me if there are any other options. He tells me he has been on Coumadin for 11 years for a PE that he experienced. He had his spleen out in the mid 90s due to a skiing accident. He does express that he is due to see his flight surgeon/clearance doctor in a month and would like to present them with some options and see if he is able to come off of his Coumadin for a short time or not.   Patient denies fever, chills, blood in the stool, melena, weight loss, fatigue, anorexia, change in bowel habits, heartburn, reflux or abdominal pain, family history of colon cancer or polyps.  Past Medical History:  Diagnosis Date  . Pulmonary embolism [facility])     Past Surgical History:  Procedure Laterality Date  . SPLEENECTOMY  1990'S    Current Outpatient Prescriptions  Medication Sig Dispense Refill  . calcium citrate-vitamin D (CITRACAL+D) 315-200 MG-UNIT per tablet Take 1 tablet by mouth 2 (two) times daily.    . Multiple Vitamin (MULTIVITAMIN WITH MINERALS) TABS tablet Take 1 tablet by mouth daily.    Marland Kitchen warfarin (COUMADIN) 5 MG tablet Take 2-3 tablets daily as directed by anticoagulation clinic 270 tablet 1   No current facility-administered medications for this visit.     Allergies as of 05/28/2016  . (No Known Allergies)    Family History  Problem Relation Age of Onset  . Bipolar disorder Mother   . Diabetes Father   .  Lung cancer Paternal Uncle     x 2 uncles  . Colon cancer Neg Hx   . Stomach cancer Neg Hx   . Rectal cancer Neg Hx   . Esophageal cancer Neg Hx   . Liver cancer Neg Hx     Social History   Social History  . Marital status: Married    Spouse name: N/A  . Number of children: 2  . Years of education: N/A   Occupational History  . Recruitment consultant    Social History Main Topics  . Smoking status: Never Smoker  . Smokeless tobacco: Never Used  . Alcohol use Yes     Comment: socially  . Drug use: No  . Sexual activity: Not on file   Other Topics Concern  . Not on file   Social History Narrative  . No narrative on file    Review of Systems:    Constitutional: No weight loss, fever or chills Skin: No rash  Cardiovascular: No chest pain Respiratory: No SOB  Gastrointestinal: See HPI and otherwise negative Genitourinary: No dysuria or change in urinary frequency Neurological: No headache Musculoskeletal: No new muscle or joint pain Hematologic: No bleeding  Psychiatric: No history of depression or anxiety   Physical Exam:  Vital signs: BP 104/80   Pulse 80   Ht 6' (1.829 m)   Wt 208 lb 6 oz (94.5 kg)   BMI  28.26 kg/m   Constitutional:   Pleasant Caucasian male appears to be in NAD, Well developed, Well nourished, alert and cooperative Head:  Normocephalic and atraumatic. Eyes:   PEERL, EOMI. No icterus. Conjunctiva pink. Ears:  Normal auditory acuity. Neck:  Supple Throat: Oral cavity and pharynx without inflammation, swelling or lesion.  Respiratory: Respirations even and unlabored. Lungs clear to auscultation bilaterally.   No wheezes, crackles, or rhonchi.  Cardiovascular: Normal S1, S2. No MRG. Regular rate and rhythm. No peripheral edema, cyanosis or pallor.  Gastrointestinal:  Soft, nondistended, nontender. No rebound or guarding. Normal bowel sounds. No appreciable masses or hepatomegaly. Rectal:  Not performed.  Msk:  Symmetrical without gross  deformities. Without edema, no deformity or joint abnormality.  Neurologic:  Alert and  oriented x4;  grossly normal neurologically.  Skin:   Dry and intact without significant lesions or rashes. Psychiatric: Demonstrates good judgement and reason without abnormal affect or behaviors.  MOST RECENT LABS AND IMAGING: CBC    Component Value Date/Time   WBC 7.4 05/22/2016 0909   WBC 14.6 (A) 04/02/2014 1049   RBC 4.82 05/22/2016 0909   RBC 5.4 04/02/2014 1049   HGB 15.5 04/02/2014 1049   HGB 14.2 11/22/2012 0937   HCT 43.2 05/22/2016 0909   PLT 287 05/22/2016 0909   MCV 90 05/22/2016 0909   MCH 30.1 05/22/2016 0909   MCH 28.8 04/02/2014 1049   MCHC 33.6 05/22/2016 0909   MCHC 32.3 04/02/2014 1049   RDW 14.3 05/22/2016 0909   LYMPHSABS 2.4 05/22/2016 0909   EOSABS 0.2 05/22/2016 0909   BASOSABS 0.1 05/22/2016 0909    CMP     Component Value Date/Time   NA 144 05/22/2016 0909   K 4.7 05/22/2016 0909   CL 105 05/22/2016 0909   CO2 25 05/22/2016 0909   GLUCOSE 89 05/22/2016 0909   GLUCOSE 87 07/01/2012 1013   BUN 13 05/22/2016 0909   CREATININE 0.92 05/22/2016 0909   CREATININE 0.94 07/01/2012 1013   CALCIUM 9.5 05/22/2016 0909   PROT 6.9 05/22/2016 0909   ALBUMIN 4.1 05/22/2016 0909   AST 22 05/22/2016 0909   ALT 17 05/22/2016 0909   ALKPHOS 63 05/22/2016 0909   BILITOT 0.4 05/22/2016 0909   GFRNONAA 97 05/22/2016 0909   GFRNONAA >89 07/01/2012 1013   GFRAA 112 05/22/2016 0909   GFRAA >89 07/01/2012 1013    Assessment: 1. Screening colonoscopy: 51 years old, no previous screening 2. Chronic anticoagulation: on coumadin for past 11 yrs for h/o PE  Plan: 1. We did discuss today that Dr. Leone PayorGessner would be willing to do the patient's colonoscopy with him on Coumadin, the patient would be aware that he is at increased risk for bleeding and that he may have to have repeat colonoscopy if there is a finding of a large polyp or one that could not be removed while he is on his  blood thinner. We also discussed the option of Cologuard. We also discussed that he could possibly be on Lovenox to bridge in between, when he was off Coumadin and the procedure if this would help.The patient would like to present these options to his flight surgeon in a month and decide what the best option for him would be.  2. Patient will call our clinic when he decides what option he would like to proceed with. If he does decide to hold his Coumadin we will need to ask his prescribing physician if this would be acceptable for the patient  to hold his Coumadin for 5 days prior to his procedure.  3. Patient was placed in recall for one month from now  Hyacinth Meeker, PA-C [facility] 05/28/2016, 2:41 PM  Cc: Allen Watson, Allen Watson, *   Agree with Ms. Lenard Simmer evaluation and management. Iva Boop, MD, Clementeen Graham

## 2016-05-28 NOTE — Patient Instructions (Signed)
It has been recommended to you by your physician that you have a(n) colonoscopy completed. Per your request, we did not schedule the procedure(s) today. Please contact our office at [phone] should you decide to have the procedure completed. 

## 2016-06-15 ENCOUNTER — Telehealth: Payer: Self-pay | Admitting: Nurse Practitioner

## 2016-06-15 MED ORDER — WARFARIN SODIUM 5 MG PO TABS: ORAL_TABLET | ORAL | 0 refills | Status: DC

## 2016-06-15 NOTE — Telephone Encounter (Signed)
Patient aware that Rx was sent to [facility]

## 2016-06-22 ENCOUNTER — Ambulatory Visit (INDEPENDENT_AMBULATORY_CARE_PROVIDER_SITE_OTHER): Admitting: Pharmacist

## 2016-06-22 DIAGNOSIS — Z86711 Personal history of pulmonary embolism: Secondary | ICD-10-CM

## 2016-06-22 DIAGNOSIS — Z7901 Long term (current) use of anticoagulants: Secondary | ICD-10-CM

## 2016-06-22 LAB — COAGUCHEK XS/INR WAIVED
INR: 2.8 — ABNORMAL HIGH (ref 0.9–1.1)
Prothrombin Time: 34.2 s

## 2016-07-27 ENCOUNTER — Ambulatory Visit (INDEPENDENT_AMBULATORY_CARE_PROVIDER_SITE_OTHER): Admitting: Pharmacist

## 2016-07-27 DIAGNOSIS — Z7901 Long term (current) use of anticoagulants: Secondary | ICD-10-CM

## 2016-07-27 DIAGNOSIS — Z86711 Personal history of pulmonary embolism: Secondary | ICD-10-CM

## 2016-07-27 LAB — COAGUCHEK XS/INR WAIVED
INR: 1.9 — ABNORMAL HIGH (ref 0.9–1.1)
Prothrombin Time: 23 s

## 2016-08-24 ENCOUNTER — Ambulatory Visit (INDEPENDENT_AMBULATORY_CARE_PROVIDER_SITE_OTHER): Admitting: Pharmacist

## 2016-08-24 DIAGNOSIS — Z7901 Long term (current) use of anticoagulants: Secondary | ICD-10-CM | POA: Diagnosis not present

## 2016-08-24 DIAGNOSIS — Z86711 Personal history of pulmonary embolism: Secondary | ICD-10-CM

## 2016-08-24 LAB — COAGUCHEK XS/INR WAIVED
INR: 2.1 — ABNORMAL HIGH (ref 0.9–1.1)
Prothrombin Time: 25.4 s

## 2016-09-17 ENCOUNTER — Ambulatory Visit (INDEPENDENT_AMBULATORY_CARE_PROVIDER_SITE_OTHER): Admitting: Pharmacist

## 2016-09-17 DIAGNOSIS — Z86711 Personal history of pulmonary embolism: Secondary | ICD-10-CM

## 2016-09-17 DIAGNOSIS — Z7901 Long term (current) use of anticoagulants: Secondary | ICD-10-CM

## 2016-09-17 LAB — COAGUCHEK XS/INR WAIVED
INR: 2.5 — AB (ref 0.9–1.1)
Prothrombin Time: 29.6 s

## 2016-09-21 ENCOUNTER — Encounter: Payer: Self-pay | Admitting: Pharmacist

## 2016-09-22 NOTE — Addendum Note (Signed)
Addended by: Eryc Bodey B on: 09/22/2016 01:49 PM   Modules accepted: Level of Service  

## 2016-10-19 ENCOUNTER — Ambulatory Visit (INDEPENDENT_AMBULATORY_CARE_PROVIDER_SITE_OTHER): Admitting: Pharmacist

## 2016-10-19 DIAGNOSIS — Z86711 Personal history of pulmonary embolism: Secondary | ICD-10-CM | POA: Diagnosis not present

## 2016-10-19 DIAGNOSIS — Z7901 Long term (current) use of anticoagulants: Secondary | ICD-10-CM

## 2016-10-19 LAB — COAGUCHEK XS/INR WAIVED
INR: 3.3 — AB (ref 0.9–1.1)
Prothrombin Time: 39 s

## 2016-11-06 ENCOUNTER — Ambulatory Visit (INDEPENDENT_AMBULATORY_CARE_PROVIDER_SITE_OTHER): Admitting: Pharmacist Clinician (PhC)/ Clinical Pharmacy Specialist

## 2016-11-06 DIAGNOSIS — Z7901 Long term (current) use of anticoagulants: Secondary | ICD-10-CM

## 2016-11-06 DIAGNOSIS — Z86711 Personal history of pulmonary embolism: Secondary | ICD-10-CM

## 2016-11-06 LAB — COAGUCHEK XS/INR WAIVED
INR: 1.9 — ABNORMAL HIGH (ref 0.9–1.1)
PROTHROMBIN TIME: 23 s

## 2016-11-06 NOTE — Patient Instructions (Signed)
Anticoagulation Warfarin Dose Instructions as of 11/06/2016      Allen SmilesSun Mon Tue Wed Thu Fri Sat   New Dose 10 mg 10 mg 10 mg 10 mg 10 mg 15 mg 10 mg    Description   Continue same dose of warfarin  INR today 1.9

## 2016-12-04 ENCOUNTER — Encounter: Payer: Self-pay | Admitting: Pharmacist Clinician (PhC)/ Clinical Pharmacy Specialist

## 2016-12-07 ENCOUNTER — Ambulatory Visit (INDEPENDENT_AMBULATORY_CARE_PROVIDER_SITE_OTHER): Admitting: *Deleted

## 2016-12-07 DIAGNOSIS — Z86711 Personal history of pulmonary embolism: Secondary | ICD-10-CM

## 2016-12-07 DIAGNOSIS — Z23 Encounter for immunization: Secondary | ICD-10-CM

## 2016-12-07 DIAGNOSIS — Z7901 Long term (current) use of anticoagulants: Secondary | ICD-10-CM | POA: Diagnosis not present

## 2016-12-07 LAB — COAGUCHEK XS/INR WAIVED
INR: 2.2 — AB (ref 0.9–1.1)
PROTHROMBIN TIME: 26.3 s

## 2016-12-07 NOTE — Progress Notes (Signed)
Subjective:     Indication: PE Bleeding signs/symptoms: None Thromboembolic signs/symptoms: None  Missed Coumadin doses: None Medication changes: no Dietary changes: no Bacterial/viral infection: no Other concerns: no  The following portions of the patient's history were reviewed and updated as appropriate: past family history, past medical history, past social history, past surgical history and problem list.  Review of Systems Pertinent items are noted in HPI.   Objective:    INR Today: 2.2  Current dose:  daily except for  on Fridays  Assessment:    Therapeutic INR for goal of 2-3   Plan:    1. New dose: no change   2. Next INR: 1 month    Demetrios Loll, RN  I have reviewed and agree with the above AWV documentation.   Jannifer Rodney, FNP

## 2017-01-04 ENCOUNTER — Ambulatory Visit (INDEPENDENT_AMBULATORY_CARE_PROVIDER_SITE_OTHER): Admitting: *Deleted

## 2017-01-04 DIAGNOSIS — Z86711 Personal history of pulmonary embolism: Secondary | ICD-10-CM

## 2017-01-04 DIAGNOSIS — Z7901 Long term (current) use of anticoagulants: Secondary | ICD-10-CM | POA: Diagnosis not present

## 2017-01-04 LAB — COAGUCHEK XS/INR WAIVED
INR: 1.8 — AB (ref 0.9–1.1)
PROTHROMBIN TIME: 22.1 s

## 2017-01-04 NOTE — Progress Notes (Signed)
Subjective:     Indication: PE Bleeding signs/symptoms: None Thromboembolic signs/symptoms: None  Missed Coumadin doses: None Medication changes: no Dietary changes: no Bacterial/viral infection: no Other concerns: no  The following portions of the patient's history were reviewed and updated as appropriate: allergies and current medications.  Review of Systems Pertinent items are noted in HPI.   Objective:    INR Today: 1.8 Current dose: 15mg  on Fridays and 10mg  all other days.     Assessment:    Subtherapeutic INR for goal of 2-3   Plan:    1. New dose: 15mg  today and then resume schedule of 15mg  on Fridays and 10mg  all other days.    2. Next INR: 1 month    Discussed with Bennie PieriniMary Margaret Martin, FNP.   Demetrios LollKristen Lochlin Eppinger, RN

## 2017-01-04 NOTE — Patient Instructions (Addendum)
Anticoagulation Warfarin Dose Instructions as of 01/04/2017      Glynis SmilesSun Mon Tue Wed Thu Fri Sat   New Dose 10 mg 10 mg 10 mg 10 mg 10 mg 15 mg 10 mg    Description   Take 15mg  today and then resume normal schedule of 15mg  on Fridays and 10 mg all other days.   INR today 1.8  Your goal is 2.0-3.0     l;

## 2017-02-01 ENCOUNTER — Ambulatory Visit (INDEPENDENT_AMBULATORY_CARE_PROVIDER_SITE_OTHER): Admitting: *Deleted

## 2017-02-01 ENCOUNTER — Encounter: Admitting: *Deleted

## 2017-02-01 DIAGNOSIS — Z86711 Personal history of pulmonary embolism: Secondary | ICD-10-CM

## 2017-02-01 DIAGNOSIS — Z7901 Long term (current) use of anticoagulants: Secondary | ICD-10-CM | POA: Diagnosis not present

## 2017-02-01 LAB — COAGUCHEK XS/INR WAIVED
INR: 2.7 — ABNORMAL HIGH (ref 0.9–1.1)
Prothrombin Time: 32.9 s

## 2017-02-01 NOTE — Progress Notes (Signed)
Subjective:     Indication: PE Bleeding signs/symptoms: None Thromboembolic signs/symptoms: None  Missed Coumadin doses: None Medication changes: no Dietary changes: no Bacterial/viral infection: no Other concerns: no  The following portions of the patient's history were reviewed and updated as appropriate: allergies and current medications.  Review of Systems Pertinent items are noted in HPI.   Objective:    INR Today: 2.7  Current dose: 15mg  on Fri and 10mg  all other days  Assessment:    Therapeutic INR for goal of 2-3   Plan:    1. New dose: no change   2. Next INR: 1 month    Demetrios LollKristen Aadam Zhen, RN [name] Daphine DeutscherMartin, FNP

## 2017-02-05 ENCOUNTER — Ambulatory Visit (INDEPENDENT_AMBULATORY_CARE_PROVIDER_SITE_OTHER): Admitting: Family Medicine

## 2017-02-05 ENCOUNTER — Encounter: Payer: Self-pay | Admitting: Family Medicine

## 2017-02-05 VITALS — BP 124/84 | HR 92 | Temp 98.7°F | Ht 72.0 in | Wt 226.0 lb

## 2017-02-05 DIAGNOSIS — J029 Acute pharyngitis, unspecified: Secondary | ICD-10-CM | POA: Diagnosis not present

## 2017-02-05 LAB — RAPID STREP SCREEN (MED CTR MEBANE ONLY): Strep Gp A Ag, IA W/Reflex: NEGATIVE

## 2017-02-05 LAB — CULTURE, GROUP A STREP

## 2017-02-05 MED ORDER — AMOXICILLIN-POT CLAVULANATE 875-125 MG PO TABS: 1.0000 | ORAL_TABLET | Freq: Two times a day (BID) | ORAL | 0 refills | Status: DC

## 2017-02-05 NOTE — Progress Notes (Signed)
Chief Complaint  Patient presents with  . Sore Throat    achey, chills, HA     HPI  Patient presents today for Patient presents with upper respiratory congestion. Rhinorrhea that is frequently purulent. There is moderate sore throat. Patient reports coughing frequently as well.  No sputum noted. Pt. Has no fever, chills or sweats. The patient denies being short of breath. Onset was 2 days ago. Gradually worsening. Tried OTCs without improvement.  PMH: Smoking status noted ROS: Per HPI  Objective: BP 124/84 (BP Location: Left Arm)   Pulse 92   Temp 98.7 F (37.1 C) (Oral)   Ht 6' (1.829 m)   Wt 226 lb (102.5 kg)   BMI 30.65 kg/m  Gen: NAD, alert, cooperative with exam HEENT: NCAT, Nasal passages swollen, red pharynx angry red. Swollen, exudative. Tonsils absent. 2+ anteriior cervical nodes CV: RRR, good S1/S2, no murmur Resp: CTA Ext: No edema, warm Neuro: Alert and oriented, No gross deficits  Assessment and plan:  1. Acute suppurative pharyngitis   2. Sore throat     Meds ordered this encounter  Medications  . amoxicillin-clavulanate (AUGMENTIN) 875-125 MG tablet    Sig: Take 1 tablet by mouth 2 (two) times daily. Take all of this medication    Dispense:  20 tablet    Refill:  0    Orders Placed This Encounter  Procedures  . Rapid Strep Screen ([facility])    Follow up as needed.  Mechele ClaudeWarren Katalaya Beel, MD

## 2017-02-26 ENCOUNTER — Encounter: Admitting: Nurse Practitioner

## 2017-03-05 ENCOUNTER — Ambulatory Visit (INDEPENDENT_AMBULATORY_CARE_PROVIDER_SITE_OTHER): Admitting: Pharmacist Clinician (PhC)/ Clinical Pharmacy Specialist

## 2017-03-05 DIAGNOSIS — Z7901 Long term (current) use of anticoagulants: Secondary | ICD-10-CM | POA: Diagnosis not present

## 2017-03-05 DIAGNOSIS — Z86711 Personal history of pulmonary embolism: Secondary | ICD-10-CM | POA: Diagnosis not present

## 2017-03-05 LAB — COAGUCHEK XS/INR WAIVED
INR: 2 — AB (ref 0.9–1.1)
PROTHROMBIN TIME: 24.1 s

## 2017-03-05 NOTE — Patient Instructions (Signed)
Description   Continue taking warfarin the same way  INR today is 2.0 (goal is 2-3)

## 2017-04-02 ENCOUNTER — Ambulatory Visit (INDEPENDENT_AMBULATORY_CARE_PROVIDER_SITE_OTHER): Admitting: Pharmacist Clinician (PhC)/ Clinical Pharmacy Specialist

## 2017-04-02 ENCOUNTER — Encounter: Payer: Self-pay | Admitting: Pharmacist Clinician (PhC)/ Clinical Pharmacy Specialist

## 2017-04-02 DIAGNOSIS — Z7901 Long term (current) use of anticoagulants: Secondary | ICD-10-CM | POA: Diagnosis not present

## 2017-04-02 DIAGNOSIS — Z86711 Personal history of pulmonary embolism: Secondary | ICD-10-CM | POA: Diagnosis not present

## 2017-04-02 LAB — COAGUCHEK XS/INR WAIVED
INR: 1.8 — ABNORMAL HIGH (ref 0.9–1.1)
PROTHROMBIN TIME: 21.9 s

## 2017-04-02 NOTE — Patient Instructions (Signed)
Description   Change to taking 3 tablets on Monday and Fridays and 2 tablets all other days of the week  INR today is 1.8 (goal is 2-3)

## 2017-04-20 ENCOUNTER — Telehealth: Payer: Self-pay | Admitting: Nurse Practitioner

## 2017-04-20 NOTE — Telephone Encounter (Signed)
What is the name of the medication? warfarin (COUMADIN) 5 MG tablet  Have you contacted your pharmacy to request a refill? yes  Which pharmacy would you like this sent to? [facility]   Patient notified that their request is being sent to the clinical staff for review and that they should receive a call once it is complete. If they do not receive a call within 24 hours they can check with their pharmacy or our office.

## 2017-04-20 NOTE — Telephone Encounter (Signed)
Please advise 

## 2017-04-21 MED ORDER — WARFARIN SODIUM 5 MG PO TABS: ORAL_TABLET | ORAL | 0 refills | Status: DC

## 2017-04-24 ENCOUNTER — Telehealth: Payer: Self-pay | Admitting: Nurse Practitioner

## 2017-04-25 ENCOUNTER — Other Ambulatory Visit: Payer: Self-pay | Admitting: Nurse Practitioner

## 2017-04-26 NOTE — Telephone Encounter (Signed)
[facility]

## 2017-04-30 ENCOUNTER — Ambulatory Visit (INDEPENDENT_AMBULATORY_CARE_PROVIDER_SITE_OTHER): Admitting: Pharmacist Clinician (PhC)/ Clinical Pharmacy Specialist

## 2017-04-30 DIAGNOSIS — Z86711 Personal history of pulmonary embolism: Secondary | ICD-10-CM | POA: Diagnosis not present

## 2017-04-30 DIAGNOSIS — Z7901 Long term (current) use of anticoagulants: Secondary | ICD-10-CM | POA: Diagnosis not present

## 2017-04-30 LAB — COAGUCHEK XS/INR WAIVED
INR: 2.4 — AB (ref 0.9–1.1)
Prothrombin Time: 28.6 s

## 2017-04-30 NOTE — Patient Instructions (Signed)
Description   Change to taking 3 tablets on Monday and Fridays and 2 tablets all other days of the week  INR today is 2.4  (goal is 2-3)

## 2017-05-28 ENCOUNTER — Encounter: Payer: Self-pay | Admitting: Nurse Practitioner

## 2017-05-28 ENCOUNTER — Ambulatory Visit (INDEPENDENT_AMBULATORY_CARE_PROVIDER_SITE_OTHER): Admitting: Nurse Practitioner

## 2017-05-28 VITALS — BP 124/81 | HR 80 | Temp 97.2°F | Ht 72.0 in | Wt 219.0 lb

## 2017-05-28 DIAGNOSIS — Z86711 Personal history of pulmonary embolism: Secondary | ICD-10-CM | POA: Diagnosis not present

## 2017-05-28 DIAGNOSIS — Z7901 Long term (current) use of anticoagulants: Secondary | ICD-10-CM | POA: Diagnosis not present

## 2017-05-28 LAB — COAGUCHEK XS/INR WAIVED
INR: 1.6 — AB (ref 0.9–1.1)
Prothrombin Time: 18.6 s

## 2017-05-28 NOTE — Progress Notes (Addendum)
Subjective:   Patient in for INR only   Indication: PE Bleeding signs/symptoms: None Thromboembolic signs/symptoms: None  Missed Coumadin doses: 1 last week Medication changes: no Dietary changes: no Bacterial/viral infection: no Other concerns: no  The following portions of the patient's history were reviewed and updated as appropriate: allergies, current medications, past family history, past medical history, past social history, past surgical history and problem list.  Review of Systems Pertinent items noted in HPI and remainder of comprehensive ROS otherwise negative.   Objective:    INR Today: 1.6 Current dose: coumadin 10mg  daily except 15mg  on Monday and friday   BP 124/81   Pulse 80   Temp (!) 97.2 F (36.2 C) (Oral)   Ht 6' (1.829 m)   Wt 219 lb (99.3 kg)   BMI 29.70 kg/m   Assessment:    Subtherapeutic INR for goal of 2-3   Plan:    1. New dose: 15mg  sat and Sunday then back to regular dose   2. Next INR: 1 month    [name] Daphine DeutscherMartin, FNP

## 2017-06-04 ENCOUNTER — Ambulatory Visit (INDEPENDENT_AMBULATORY_CARE_PROVIDER_SITE_OTHER): Admitting: Pharmacist Clinician (PhC)/ Clinical Pharmacy Specialist

## 2017-06-04 DIAGNOSIS — Z86711 Personal history of pulmonary embolism: Secondary | ICD-10-CM

## 2017-06-04 DIAGNOSIS — Z7901 Long term (current) use of anticoagulants: Secondary | ICD-10-CM | POA: Diagnosis not present

## 2017-06-04 LAB — COAGUCHEK XS/INR WAIVED
INR: 2 — AB (ref 0.9–1.1)
PROTHROMBIN TIME: 24 s

## 2017-07-02 ENCOUNTER — Ambulatory Visit (INDEPENDENT_AMBULATORY_CARE_PROVIDER_SITE_OTHER): Admitting: Pharmacist Clinician (PhC)/ Clinical Pharmacy Specialist

## 2017-07-02 DIAGNOSIS — Z86711 Personal history of pulmonary embolism: Secondary | ICD-10-CM | POA: Diagnosis not present

## 2017-07-02 DIAGNOSIS — Z7901 Long term (current) use of anticoagulants: Secondary | ICD-10-CM

## 2017-07-02 LAB — COAGUCHEK XS/INR WAIVED
INR: 2.2 — AB (ref 0.9–1.1)
PROTHROMBIN TIME: 26.9 s

## 2017-07-30 ENCOUNTER — Ambulatory Visit: Admitting: Pharmacist Clinician (PhC)/ Clinical Pharmacy Specialist

## 2017-07-30 DIAGNOSIS — Z86711 Personal history of pulmonary embolism: Secondary | ICD-10-CM

## 2017-07-30 DIAGNOSIS — Z7901 Long term (current) use of anticoagulants: Secondary | ICD-10-CM

## 2017-07-30 LAB — COAGUCHEK XS/INR WAIVED
INR: 2.3 — ABNORMAL HIGH (ref 0.9–1.1)
Prothrombin Time: 27.6 s

## 2017-07-30 NOTE — Patient Instructions (Signed)
Description   Continue same schedule   INR today is 2.3  (goal is 2-3) Perfect reading

## 2017-09-03 ENCOUNTER — Ambulatory Visit (INDEPENDENT_AMBULATORY_CARE_PROVIDER_SITE_OTHER): Admitting: Pharmacist Clinician (PhC)/ Clinical Pharmacy Specialist

## 2017-09-03 DIAGNOSIS — Z86711 Personal history of pulmonary embolism: Secondary | ICD-10-CM | POA: Diagnosis not present

## 2017-09-03 DIAGNOSIS — Z7901 Long term (current) use of anticoagulants: Secondary | ICD-10-CM

## 2017-09-03 LAB — COAGUCHEK XS/INR WAIVED
INR: 2.5 — ABNORMAL HIGH (ref 0.9–1.1)
Prothrombin Time: 29.8 s

## 2017-09-03 NOTE — Patient Instructions (Signed)
Description   Continue same schedule   INR today is 2.5  (goal is 2-3) Perfect reading

## 2017-10-01 ENCOUNTER — Ambulatory Visit (INDEPENDENT_AMBULATORY_CARE_PROVIDER_SITE_OTHER): Admitting: Pharmacist Clinician (PhC)/ Clinical Pharmacy Specialist

## 2017-10-01 DIAGNOSIS — Z7901 Long term (current) use of anticoagulants: Secondary | ICD-10-CM

## 2017-10-01 DIAGNOSIS — Z86711 Personal history of pulmonary embolism: Secondary | ICD-10-CM

## 2017-10-01 LAB — COAGUCHEK XS/INR WAIVED
INR: 2.3 — AB (ref 0.9–1.1)
Prothrombin Time: 27.9 s

## 2017-10-01 NOTE — Patient Instructions (Signed)
Description   Continue same schedule   INR today is 2.3  (goal is 2-3) Perfect reading

## 2017-10-29 ENCOUNTER — Ambulatory Visit: Admitting: Pharmacist Clinician (PhC)/ Clinical Pharmacy Specialist

## 2017-10-29 DIAGNOSIS — Z7901 Long term (current) use of anticoagulants: Secondary | ICD-10-CM

## 2017-10-29 DIAGNOSIS — Z86711 Personal history of pulmonary embolism: Secondary | ICD-10-CM

## 2017-10-29 LAB — COAGUCHEK XS/INR WAIVED
INR: 2.1 — ABNORMAL HIGH (ref 0.9–1.1)
Prothrombin Time: 25.5 s

## 2017-10-29 NOTE — Patient Instructions (Signed)
Description   Continue same schedule   INR today is 2.1  (goal is 2-3) Perfect reading

## 2017-11-24 ENCOUNTER — Encounter: Payer: Self-pay | Admitting: Pharmacist Clinician (PhC)/ Clinical Pharmacy Specialist

## 2017-11-26 ENCOUNTER — Ambulatory Visit (INDEPENDENT_AMBULATORY_CARE_PROVIDER_SITE_OTHER): Admitting: Family Medicine

## 2017-11-26 ENCOUNTER — Encounter: Payer: Self-pay | Admitting: Family Medicine

## 2017-11-26 DIAGNOSIS — Z7901 Long term (current) use of anticoagulants: Secondary | ICD-10-CM | POA: Diagnosis not present

## 2017-11-26 DIAGNOSIS — Z86711 Personal history of pulmonary embolism: Secondary | ICD-10-CM | POA: Diagnosis not present

## 2017-11-26 LAB — COAGUCHEK XS/INR WAIVED
INR: 2.6 — AB (ref 0.9–1.1)
Prothrombin Time: 30.9 s

## 2017-11-26 LAB — PROTIME-INR: INR: 2.6 — AB (ref 0.9–1.1)

## 2017-11-26 NOTE — Addendum Note (Signed)
Addended by: Gwenith DailyHUDY, Rajendra Spiller N on: 11/26/2017 05:04 PM   Modules accepted: Orders

## 2017-11-26 NOTE — Progress Notes (Signed)
Chief Complaint  Patient presents with  . Follow-up    pt here today for Protime    HPI  Patient presents today for Patient in for follow-up of pulmonary emboli.  First noted 10 years ago.  Most recent 4 years ago.  Patient denies any recent bouts of chest pain. Additionally, patient is taking anticoagulants. Patient denies any recent excessive bleeding episodes including epistaxis, bleeding from the gums, genitalia, rectal bleeding or hematuria. Additionally there has been no excessive bruising.  PMH: Smoking status noted ROS: Per HPI  Objective: BP 125/81   Pulse (!) 49   Temp (!) 97.4 F (36.3 C) (Oral)   Ht 6' (1.829 m)   Wt 197 lb 2 oz (89.4 kg)   BMI 26.73 kg/m  Gen: NAD, alert, cooperative with exam HEENT: NCAT, EOMI, PERRL CV: RRR, good S1/S2, no murmur Resp: CTABL, no wheezes, non-labored Ext: No edema, warm Neuro: Alert and oriented, No gross deficits Anticoagulation Summary  As of 11/26/2017   INR goal:   2.0-3.0  TTR:   86.1 % (5.3 y)  INR used for dosing:   2.6 (11/26/2017)  Warfarin maintenance plan:   15 mg (5 mg x 3) every Mon, Fri; 10 mg (5 mg x 2) all other days  Weekly warfarin total:   80 mg  Weekly max warfarin dose:   85 mg  No change documented:   Mechele ClaudeStacks, Valente Fosberg, MD  Plan last modified:   Bennie PieriniMartin, [name], FNP (05/28/2017)  Next INR check:     Target end date:      Indications   History of pulmonary embolus (PE) [Z86.711] Chronic anticoagulation [Z79.01]        Anticoagulation Episode Summary    INR check location:      Preferred lab:      Send INR reminders to:   [facility]   Comments:       Anticoagulation Care Providers    Provider Role Specialty Phone number   Bennie PieriniMartin, [name], FNP Referring Family Medicine [phone]     Assessment and plan:  1. History of pulmonary embolus (PE)   2. Chronic anticoagulation     No orders of the defined types were placed in this encounter.   Orders Placed This Encounter   Procedures  . Protime-INR    This back office order was created through the Results Console.    Follow up monthly Mechele ClaudeWarren Burnis Halling, MD

## 2017-12-27 ENCOUNTER — Encounter: Payer: Self-pay | Admitting: Nurse Practitioner

## 2017-12-27 ENCOUNTER — Telehealth: Payer: Self-pay | Admitting: Nurse Practitioner

## 2017-12-27 ENCOUNTER — Ambulatory Visit (INDEPENDENT_AMBULATORY_CARE_PROVIDER_SITE_OTHER): Admitting: Nurse Practitioner

## 2017-12-27 DIAGNOSIS — Z86711 Personal history of pulmonary embolism: Secondary | ICD-10-CM | POA: Diagnosis not present

## 2017-12-27 DIAGNOSIS — Z7901 Long term (current) use of anticoagulants: Secondary | ICD-10-CM | POA: Diagnosis not present

## 2017-12-27 DIAGNOSIS — Z23 Encounter for immunization: Secondary | ICD-10-CM

## 2017-12-27 LAB — COAGUCHEK XS/INR WAIVED
INR: 2.4 — AB (ref 0.9–1.1)
PROTHROMBIN TIME: 28.5 s

## 2017-12-27 NOTE — Progress Notes (Signed)
Subjective:     Indication: PE Bleeding signs/symptoms: None Thromboembolic signs/symptoms: None  Missed Coumadin doses: None Medication changes: no Dietary changes: no Bacterial/viral infection: no Other concerns: no  The following portions of the patient's history were reviewed and updated as appropriate: allergies, current medications, past family history, past medical history, past social history, past surgical history and problem list.  Review of Systems Pertinent items noted in HPI and remainder of comprehensive ROS otherwise negative.   Objective:    INR Today: 2.4 Current dose: coumadin 10mg  daily except 15mg  on Monday and friday    Assessment:    Therapeutic INR for goal of 2-3   Plan:    1. New dose: no change   2. Next INR: 1 month    [name] [name], FNP

## 2017-12-27 NOTE — Telephone Encounter (Signed)
Patient scheduled for Protime today

## 2017-12-28 ENCOUNTER — Ambulatory Visit: Payer: Self-pay | Admitting: Nurse Practitioner

## 2017-12-29 ENCOUNTER — Encounter: Admitting: Pharmacist Clinician (PhC)/ Clinical Pharmacy Specialist

## 2018-01-26 ENCOUNTER — Ambulatory Visit (INDEPENDENT_AMBULATORY_CARE_PROVIDER_SITE_OTHER): Admitting: Pharmacist Clinician (PhC)/ Clinical Pharmacy Specialist

## 2018-01-26 DIAGNOSIS — Z7901 Long term (current) use of anticoagulants: Secondary | ICD-10-CM

## 2018-01-26 DIAGNOSIS — Z86711 Personal history of pulmonary embolism: Secondary | ICD-10-CM | POA: Diagnosis not present

## 2018-01-26 LAB — COAGUCHEK XS/INR WAIVED
INR: 2.2 — AB (ref 0.9–1.1)
PROTHROMBIN TIME: 26.7 s

## 2018-03-03 ENCOUNTER — Ambulatory Visit: Admitting: Nurse Practitioner

## 2018-03-03 VITALS — BP 127/84 | HR 65 | Temp 98.0°F | Ht 72.0 in | Wt 204.4 lb

## 2018-03-03 DIAGNOSIS — Z86711 Personal history of pulmonary embolism: Secondary | ICD-10-CM

## 2018-03-03 DIAGNOSIS — Z7901 Long term (current) use of anticoagulants: Secondary | ICD-10-CM

## 2018-03-03 LAB — COAGUCHEK XS/INR WAIVED
INR: 2.4 — ABNORMAL HIGH (ref 0.9–1.1)
Prothrombin Time: 28.5 s

## 2018-03-03 NOTE — Progress Notes (Signed)
Subjective:   CHIEF COMPLAINT: INR recheck   Indication: PE Bleeding signs/symptoms: None Thromboembolic signs/symptoms: None  Missed Coumadin doses: None Medication changes: no Dietary changes: no Bacterial/viral infection: no Other concerns: no  The following portions of the patient's history were reviewed and updated as appropriate: allergies, current medications, past family history, past medical history, past social history, past surgical history and problem list.  Review of Systems Pertinent items noted in HPI and remainder of comprehensive ROS otherwise negative.   Objective:    INR Today: 2.4 Current dose: coumadin 10mg  daily except 15mg  on Monday and friday    Assessment:    Therapeutic INR for goal of 2-3   Plan:    1. New dose: no change   2. Next INR: 1 month    [name] Daphine DeutscherMartin, FNP

## 2018-04-04 ENCOUNTER — Other Ambulatory Visit: Payer: Self-pay | Admitting: Nurse Practitioner

## 2018-04-06 ENCOUNTER — Ambulatory Visit (INDEPENDENT_AMBULATORY_CARE_PROVIDER_SITE_OTHER): Admitting: Pharmacist Clinician (PhC)/ Clinical Pharmacy Specialist

## 2018-04-06 DIAGNOSIS — Z7901 Long term (current) use of anticoagulants: Secondary | ICD-10-CM | POA: Diagnosis not present

## 2018-04-06 DIAGNOSIS — Z86711 Personal history of pulmonary embolism: Secondary | ICD-10-CM | POA: Diagnosis not present

## 2018-04-06 LAB — COAGUCHEK XS/INR WAIVED
INR: 2.2 — AB (ref 0.9–1.1)
Prothrombin Time: 26.6 s

## 2018-04-27 ENCOUNTER — Ambulatory Visit (INDEPENDENT_AMBULATORY_CARE_PROVIDER_SITE_OTHER): Admitting: Pharmacist Clinician (PhC)/ Clinical Pharmacy Specialist

## 2018-04-27 DIAGNOSIS — Z86711 Personal history of pulmonary embolism: Secondary | ICD-10-CM

## 2018-04-27 DIAGNOSIS — Z7901 Long term (current) use of anticoagulants: Secondary | ICD-10-CM | POA: Diagnosis not present

## 2018-04-27 LAB — COAGUCHEK XS/INR WAIVED
INR: 2.4 — ABNORMAL HIGH (ref 0.9–1.1)
PROTHROMBIN TIME: 29.2 s

## 2018-05-04 ENCOUNTER — Encounter: Payer: Self-pay | Admitting: Pharmacist Clinician (PhC)/ Clinical Pharmacy Specialist

## 2018-05-19 ENCOUNTER — Other Ambulatory Visit: Payer: Self-pay

## 2018-05-19 ENCOUNTER — Ambulatory Visit (INDEPENDENT_AMBULATORY_CARE_PROVIDER_SITE_OTHER): Admitting: Nurse Practitioner

## 2018-05-19 ENCOUNTER — Ambulatory Visit (INDEPENDENT_AMBULATORY_CARE_PROVIDER_SITE_OTHER)

## 2018-05-19 ENCOUNTER — Encounter: Payer: Self-pay | Admitting: Nurse Practitioner

## 2018-05-19 VITALS — BP 137/83 | HR 69 | Temp 97.9°F | Ht 72.0 in | Wt 210.0 lb

## 2018-05-19 DIAGNOSIS — Z86711 Personal history of pulmonary embolism: Secondary | ICD-10-CM

## 2018-05-19 DIAGNOSIS — Z0000 Encounter for general adult medical examination without abnormal findings: Secondary | ICD-10-CM

## 2018-05-19 DIAGNOSIS — Z7901 Long term (current) use of anticoagulants: Secondary | ICD-10-CM

## 2018-05-19 DIAGNOSIS — Z1211 Encounter for screening for malignant neoplasm of colon: Secondary | ICD-10-CM

## 2018-05-19 DIAGNOSIS — Z1212 Encounter for screening for malignant neoplasm of rectum: Secondary | ICD-10-CM

## 2018-05-19 LAB — COAGUCHEK XS/INR WAIVED
INR: 2.6 — AB (ref 0.9–1.1)
Prothrombin Time: 31.5 s

## 2018-05-19 MED ORDER — WARFARIN SODIUM 5 MG PO TABS: ORAL_TABLET | ORAL | 1 refills | Status: DC

## 2018-05-19 NOTE — Patient Instructions (Signed)
Pulmonary Embolism    A pulmonary embolism (PE) is a sudden blockage or decrease of blood flow in one lung or both lungs. Most blockages come from a blood clot that forms in a lower leg, thigh, or arm vein (deep vein thrombosis, DVT) and travels to the lungs. A clot is blood that has thickened into a gel or solid. PE is a dangerous and life-threatening condition that needs to be treated right away.  What are the causes?  This condition is usually caused by a blood clot that forms in a vein and moves to the lungs. In rare cases, it may be caused by air, fat, part of a tumor, or other tissue that moves through the veins and into the lungs.  What increases the risk?  The following factors may make you more likely to develop this condition:  · Traumatic injury, such as breaking a hip or leg.  · Spinal cord injury.  · Orthopedic surgery, especially hip or knee replacement.  · Any major surgery.  · Stroke.  · Having DVT.  · Blood clots or blood clotting disease.  · Long-term (chronic) lung or heart disease.  · Taking medicines that contain estrogen. These include birth control pills and hormone replacement therapy.  · Cancer and chemotherapy.  · Having a central venous catheter.  · Pregnancy and the period of time after delivery (postpartum).  · Being older than age 60.  · Being overweight.  · Smoking.  What are the signs or symptoms?  Symptoms of this condition usually start suddenly and include:  · Shortness of breath during activity or at rest.  · Coughing or coughing up blood or blood-tinged mucus.  · Chest pain that is often worse with deep breaths.  · Rapid or irregular heartbeat.  · Feeling light-headed or dizzy.  · Fainting.  · Feeling anxious.  · Fever.  · Sweating.  · Pain and swelling in a leg. This is a symptom of DVT, which can lead to PE.  How is this diagnosed?  This condition may be diagnosed based on:  · Your medical history.  · A physical exam.  · Blood tests.  · CT pulmonary angiogram. This test checks  blood flow in and around your lungs.  · Ventilation-perfusion scan, also called a lung VQ scan. This test measures air flow and blood flow to the lungs.  · Ultrasound of the legs.  How is this treated?  Treatment for this condition depends on many factors, such as the cause of your PE, your risk for bleeding or developing more clots, and other medical conditions you have. Treatment aims to remove, dissolve, or stop blood clots from forming or growing larger. Treatment may include:  · Medicines, such as:  ? Blood thinning medicines (anticoagulants) to stop clots from forming or growing.  ? Medicines that dissolve clots (thrombolytics).  · Procedures, such as:  ? Using a flexible tube to remove a blood clot (embolectomy) or deliver medicine to destroy it (catheter-directed thrombolysis).  ? Inserting a filter into a large vein that carries blood to the heart (inferior vena cava). This filter (vena cava filter) catches blood clots before they reach the lungs.  ? Surgery to remove the clot (surgical embolectomy). This is rare.  You may need a combination of immediate, long-term (up to 3 months after diagnosis), and extended (more than 3 months after diagnosis) treatments. Your treatment may continue for several months (maintenance therapy). You and your health care provider will work together   to choose the treatment program that is best for you.  Follow these instructions at home:  Medicines  · Take over-the-counter and prescription medicines only as told by your health care provider.  · If you are taking an anticoagulant medicine:  ? Take the medicine every day at the same time each day.  ? Understand what foods and drugs interact with your medicine.  ? Understand the side effects of this medicine, including excessive bruising or bleeding. Ask your health care provider or pharmacist about other side effects.  General instructions  · Wear a medical alert bracelet or carry a medical alert card that says you have had a PE  and lists what medicines you take.  · Ask your health care provider when you may return to your normal activities. Avoid sitting or lying for a long time without moving.  · Maintain a healthy weight. Ask your health care provider what weight is healthy for you.  · Do not use any products that contain nicotine or tobacco, such as cigarettes and e-cigarettes. If you need help quitting, ask your health care provider.  · Talk with your health care provider about any travel plans. It is important to make sure that you are still able to take your medicine while on trips.  · Keep all follow-up visits as told by your health care provider. This is important.  Contact a health care provider if:  · You missed a dose of your blood thinner medicine.  Get help right away if:  · You have:  ? New or increased pain, swelling, warmth, or redness in an arm or leg.  ? Numbness or tingling in an arm or leg.  ? Shortness of breath during activity or at rest.  ? A fever.  ? Chest pain.  ? A rapid or irregular heartbeat.  ? A severe headache.  ? Vision changes.  ? A serious fall or accident, or you hit your head.  ? Stomach (abdominal) pain.  ? Blood in your vomit, stool, or urine.  ? A cut that will not stop bleeding.  · You cough up blood.  · You feel light-headed or dizzy.  · You cannot move your arms or legs.  · You are confused or have memory loss.  These symptoms may represent a serious problem that is an emergency. Do not wait to see if the symptoms will go away. Get medical help right away. Call your local emergency services (911 in the [place] Do not drive yourself to the hospital.  Summary  · A pulmonary embolism (PE) is a sudden blockage or decrease of blood flow in one lung or both lungs. PE is a dangerous and life-threatening condition that needs to be treated right away.  · Treatments for this condition usually include medicines to thin your blood (anticoagulants) or medicines to break apart blood clots (thrombolytics).  · If  you are given blood thinners, it is important to take the medicine every single day at the same time each day.  · If you have signs of PE or DVT, call your local emergency services (911 in the [place]  This information is not intended to replace advice given to you by your health care provider. Make sure you discuss any questions you have with your health care provider.  Document Released: 02/21/2000 Document Revised: 10/08/2017 Document Reviewed: 04/08/2017  Elsevier Interactive Patient Education © 2019 [employer]

## 2018-05-19 NOTE — Progress Notes (Signed)
Subjective:    Patient ID: Allen Watson, male    DOB: September 28, 1965, 53 y.o.   MRN: [id number]   Chief Complaint: letter for aviation and annual physical exam  HPI Patient comes in today for check up for aviation. He had a pulmonary emboli in 2008 and has been on coumadin. He has to have a letter every year showing he is stable and INR 's have been stable. He is doing well today without complaints. His last INR was 2.4 3 weeks ago. He has no other health problems and has no complaints   Indication: PE Bleeding signs/symptoms: None Thromboembolic signs/symptoms: None  Missed Coumadin doses: None Medication changes: no Dietary changes: no Bacterial/viral infection: no Other concerns: no      Review of Systems  Constitutional: Negative for activity change and appetite change.  HENT: Negative.   Eyes: Negative for pain.  Respiratory: Negative for shortness of breath.   Cardiovascular: Negative for chest pain, palpitations and leg swelling.  Gastrointestinal: Negative for abdominal pain.  Endocrine: Negative for polydipsia.  Genitourinary: Negative.   Skin: Negative for rash.  Neurological: Negative for dizziness, weakness and headaches.  Hematological: Does not bruise/bleed easily.  Psychiatric/Behavioral: Negative.   All other systems reviewed and are negative.      Objective:   Physical Exam Vitals signs and nursing note reviewed.  Constitutional:      Appearance: Normal appearance. He is well-developed.  HENT:     Head: Normocephalic.     Nose: Nose normal.  Eyes:     Pupils: Pupils are equal, round, and reactive to light.  Neck:     Musculoskeletal: Normal range of motion and neck supple.     Thyroid: No thyroid mass or thyromegaly.     Vascular: No carotid bruit or JVD.     Trachea: Phonation normal.  Cardiovascular:     Rate and Rhythm: Normal rate and regular rhythm.  Pulmonary:     Effort: Pulmonary effort is normal. No respiratory distress.     Breath  sounds: Normal breath sounds.  Abdominal:     General: Bowel sounds are normal.     Palpations: Abdomen is soft.     Tenderness: There is no abdominal tenderness.  Musculoskeletal: Normal range of motion.  Lymphadenopathy:     Cervical: No cervical adenopathy.  Skin:    General: Skin is warm and dry.  Neurological:     Mental Status: He is alert and oriented to person, place, and time.     Comments: slight shakiness of bil hands with purposeful movement Grips equal il  Psychiatric:        Behavior: Behavior normal.        Thought Content: Thought content normal.        Judgment: Judgment normal.    BP 137/83   Pulse 69   Temp 97.9 F (36.6 C) (Oral)   Ht 6' (1.829 m)   Wt 210 lb (95.3 kg)   BMI 28.48 kg/m   INR 2.6    EKG- NSR-[name] Hassell Done, FNP  Chest x ray- negative for cardiopulmonary disease Assessment & Plan:  Allen Watson comes in today with chief complaint of Medical Management of Chronic Issues   Diagnosis and orders addressed:  1. Annual physical exam - CMP14+EGFR - Lipid panel - PSA, total and free - CBC with Differential/Platelet - DG Chest 2 View; Future - EKG 12-Lead  2. History of pulmonary embolus (PE) Continue coumadin at current dose- recheck in 4 weks  Labs pending Health Maintenance reviewed Diet and exercise encouraged  Follow up plan: 1 year and monthly for INR   [name] Hassell Done, FNP

## 2018-05-19 NOTE — Addendum Note (Signed)
Addended by: Margorie John on: 05/19/2018 12:35 PM   Modules accepted: Orders

## 2018-05-20 LAB — CMP14+EGFR
ALBUMIN: 4.1 g/dL (ref 3.8–4.9)
ALT: 24 IU/L (ref 0–44)
AST: 33 IU/L (ref 0–40)
Albumin/Globulin Ratio: 1.2 (ref 1.2–2.2)
Alkaline Phosphatase: 65 IU/L (ref 39–117)
BUN/Creatinine Ratio: 17 (ref 9–20)
BUN: 15 mg/dL (ref 6–24)
Bilirubin Total: 0.3 mg/dL (ref 0.0–1.2)
CALCIUM: 8.8 mg/dL (ref 8.7–10.2)
CO2: 19 mmol/L — AB (ref 20–29)
CREATININE: 0.89 mg/dL (ref 0.76–1.27)
Chloride: 101 mmol/L (ref 96–106)
GFR calc Af Amer: 114 mL/min/{1.73_m2} (ref >59)
GFR, EST NON AFRICAN AMERICAN: 98 mL/min/{1.73_m2} (ref >59)
GLUCOSE: 122 mg/dL — AB (ref 65–99)
Globulin, Total: 3.4 g/dL (ref 1.5–4.5)
Potassium: 3.8 mmol/L (ref 3.5–5.2)
SODIUM: 136 mmol/L (ref 134–144)
Total Protein: 7.5 g/dL (ref 6.0–8.5)

## 2018-05-20 LAB — CBC WITH DIFFERENTIAL/PLATELET
Basophils Absolute: 0.1 10*3/uL (ref 0.0–0.2)
Basos: 1 %
EOS (ABSOLUTE): 0.1 10*3/uL (ref 0.0–0.4)
Eos: 1 %
Hematocrit: 41 % (ref 37.5–51.0)
Hemoglobin: 14.1 g/dL (ref 13.0–17.7)
Immature Grans (Abs): 0 10*3/uL (ref 0.0–0.1)
Immature Granulocytes: 0 %
Lymphocytes Absolute: 2.4 10*3/uL (ref 0.7–3.1)
Lymphs: 32 %
MCH: 29.6 pg (ref 26.6–33.0)
MCHC: 34.4 g/dL (ref 31.5–35.7)
MCV: 86 fL (ref 79–97)
MONOCYTES: 9 %
Monocytes Absolute: 0.7 10*3/uL (ref 0.1–0.9)
Neutrophils Absolute: 4.2 10*3/uL (ref 1.4–7.0)
Neutrophils: 57 %
Platelets: 291 10*3/uL (ref 150–450)
RBC: 4.76 x10E6/uL (ref 4.14–5.80)
RDW: 13.8 % (ref 11.6–15.4)
WBC: 7.4 10*3/uL (ref 3.4–10.8)

## 2018-05-20 LAB — PSA, TOTAL AND FREE
PSA, Free Pct: 27.1 %
PSA, Free: 0.19 ng/mL
Prostate Specific Ag, Serum: 0.7 ng/mL (ref 0.0–4.0)

## 2018-05-20 LAB — LIPID PANEL
CHOL/HDL RATIO: 3.4 ratio (ref 0.0–5.0)
Cholesterol, Total: 220 mg/dL — ABNORMAL HIGH (ref 100–199)
HDL: 65 mg/dL (ref >39)
LDL CALC: 135 mg/dL — AB (ref 0–99)
TRIGLYCERIDES: 98 mg/dL (ref 0–149)
VLDL CHOLESTEROL CAL: 20 mg/dL (ref 5–40)

## 2018-05-25 ENCOUNTER — Encounter: Payer: Self-pay | Admitting: Pharmacist Clinician (PhC)/ Clinical Pharmacy Specialist

## 2018-05-25 ENCOUNTER — Telehealth: Payer: Self-pay | Admitting: *Deleted

## 2018-05-25 NOTE — Telephone Encounter (Signed)
Patient would like INR results to be released to MyChart.  Results released per patient request

## 2018-06-22 ENCOUNTER — Other Ambulatory Visit: Payer: Self-pay

## 2018-06-22 ENCOUNTER — Ambulatory Visit: Admitting: Pharmacist Clinician (PhC)/ Clinical Pharmacy Specialist

## 2018-06-22 ENCOUNTER — Encounter: Payer: Self-pay | Admitting: Pharmacist Clinician (PhC)/ Clinical Pharmacy Specialist

## 2018-06-22 DIAGNOSIS — Z7901 Long term (current) use of anticoagulants: Secondary | ICD-10-CM

## 2018-06-22 DIAGNOSIS — Z86711 Personal history of pulmonary embolism: Secondary | ICD-10-CM

## 2018-06-22 LAB — COAGUCHEK XS/INR WAIVED
INR: 2.2 — ABNORMAL HIGH (ref 0.9–1.1)
Prothrombin Time: 25.9 s

## 2018-06-22 NOTE — Patient Instructions (Addendum)
  Description   Continue same schedule   INR today is 2.2  (goal is 2-3) Perfect reading

## 2018-07-19 ENCOUNTER — Other Ambulatory Visit: Payer: Self-pay

## 2018-07-20 ENCOUNTER — Ambulatory Visit: Admitting: Pharmacist Clinician (PhC)/ Clinical Pharmacy Specialist

## 2018-07-20 DIAGNOSIS — Z86711 Personal history of pulmonary embolism: Secondary | ICD-10-CM

## 2018-07-20 DIAGNOSIS — Z7901 Long term (current) use of anticoagulants: Secondary | ICD-10-CM

## 2018-07-20 LAB — COAGUCHEK XS/INR WAIVED
INR: 3.7 — ABNORMAL HIGH (ref 0.9–1.1)
Prothrombin Time: 44.7 s

## 2018-07-20 NOTE — Patient Instructions (Signed)
Description   No warfarin today, then resume regular schedule.  INR today is 3.7  (goal is 2-3) A little thin today from extra dose

## 2018-08-08 ENCOUNTER — Telehealth: Payer: Self-pay | Admitting: Family Medicine

## 2018-08-08 ENCOUNTER — Ambulatory Visit (INDEPENDENT_AMBULATORY_CARE_PROVIDER_SITE_OTHER): Admitting: Family Medicine

## 2018-08-08 ENCOUNTER — Encounter: Payer: Self-pay | Admitting: Family Medicine

## 2018-08-08 ENCOUNTER — Other Ambulatory Visit: Payer: Self-pay

## 2018-08-08 DIAGNOSIS — M722 Plantar fascial fibromatosis: Secondary | ICD-10-CM

## 2018-08-08 MED ORDER — NAPROXEN 500 MG PO TABS: 500.0000 mg | ORAL_TABLET | Freq: Two times a day (BID) | ORAL | 0 refills | Status: AC

## 2018-08-08 MED ORDER — PREDNISONE 20 MG PO TABS: ORAL_TABLET | ORAL | 0 refills | Status: DC

## 2018-08-08 NOTE — Telephone Encounter (Signed)
Please advise 

## 2018-08-08 NOTE — Telephone Encounter (Signed)
Taking the naproxen for 2 weeks should not interfere with his coumadin. If he does not want to take the oral medication, I can send in topical diclofenac.

## 2018-08-08 NOTE — Progress Notes (Signed)
Virtual Visit via telephone Note Due to COVID-19, visit is conducted virtually and was requested by patient. This visit type was conducted due to national recommendations for restrictions regarding the COVID-19 Pandemic (e.g. social distancing) in an effort to limit this patient's exposure and mitigate transmission in our community. All issues noted in this document were discussed and addressed.  A physical exam was not performed with this format.   I connected with Liz Malady on 08/08/18 at 0910 by telephone and verified that I am speaking with the correct person using two identifiers. Jerek Guzzetta is currently located at home and family is currently with them during visit. The provider, Kari Baars, FNP is located in their office at time of visit.  I discussed the limitations, risks, security and privacy concerns of performing an evaluation and management service by telephone and the availability of in person appointments. I also discussed with the patient that there may be a patient responsible charge related to this service. The patient expressed understanding and agreed to proceed.  Subjective:  Patient ID: Allen Watson, male    DOB: 20-Jan-1966, 53 y.o.   MRN: [id number]  Chief Complaint:  Foot Pain   HPI: Allen Watson is a 53 y.o. male presenting on 08/08/2018 for Foot Pain   Pt reports left heel pain that started several weeks ago. States the pain is worse first thing in the morning and after periods or rest. Running and hiking exacerbates the pain. No known injury. States the pain runs from his heel to the center of his foot. He has tried different shoes with some providing minimal relief. He has tried topical rubs without relief of symptoms.   Foot Pain  This is a new problem. The current episode started 1 to 4 weeks ago. The problem occurs daily. The problem has been gradually worsening. Associated symptoms include arthralgias. Pertinent negatives include no chest pain,  chills, coughing, diaphoresis, fatigue, fever, joint swelling, myalgias, neck pain, numbness or weakness. The symptoms are aggravated by exertion and walking. He has tried walking and rest for the symptoms. The treatment provided mild relief.     Relevant past medical, surgical, family, and social history reviewed and updated as indicated.  Allergies and medications reviewed and updated.   Past Medical History:  Diagnosis Date  . Pulmonary embolism [facility])     Past Surgical History:  Procedure Laterality Date  . SPLEENECTOMY  1990'S    Social History   Socioeconomic History  . Marital status: Married    Spouse name: Not on file  . Number of children: 2  . Years of education: Not on file  . Highest education level: Not on file  Occupational History  . Occupation: Recruitment consultant  Social Needs  . Financial resource strain: Not on file  . Food insecurity:    Worry: Not on file    Inability: Not on file  . Transportation needs:    Medical: Not on file    Non-medical: Not on file  Tobacco Use  . Smoking status: Never Smoker  . Smokeless tobacco: Never Used  Substance and Sexual Activity  . Alcohol use: Yes    Comment: socially  . Drug use: No  . Sexual activity: Not on file  Lifestyle  . Physical activity:    Days per week: Not on file    Minutes per session: Not on file  . Stress: Not on file  Relationships  . Social connections:    Talks on phone: Not on file  Gets together: Not on file    Attends religious service: Not on file    Active member of club or organization: Not on file    Attends meetings of clubs or organizations: Not on file    Relationship status: Not on file  . Intimate partner violence:    Fear of current or ex partner: Not on file    Emotionally abused: Not on file    Physically abused: Not on file    Forced sexual activity: Not on file  Other Topics Concern  . Not on file  Social History Narrative  . Not on file    Outpatient  Encounter Medications as of 08/08/2018  Medication Sig  . calcium citrate-vitamin D (CITRACAL+D) 315-200 MG-UNIT per tablet Take 1 tablet by mouth 2 (two) times daily.  . Multiple Vitamin (MULTIVITAMIN WITH MINERALS) TABS tablet Take 1 tablet by mouth daily.  . naproxen (NAPROSYN) 500 MG tablet Take 1 tablet (500 mg total) by mouth 2 (two) times daily with a meal for 14 days.  . predniSONE (DELTASONE) 20 MG tablet 2 po at sametime daily for 5 days  . warfarin (COUMADIN) 5 MG tablet TAKE 2 TO 3 TABLETS DAILY AS DIRECTED BY ANTICOAGULATION CLINIC   No facility-administered encounter medications on file as of 08/08/2018.     No Known Allergies  Review of Systems  Constitutional: Negative for activity change, appetite change, chills, diaphoresis, fatigue, fever and unexpected weight change.  Respiratory: Negative for cough and shortness of breath.   Cardiovascular: Negative for chest pain, palpitations and leg swelling.  Musculoskeletal: Positive for arthralgias and gait problem. Negative for back pain, joint swelling, myalgias, neck pain and neck stiffness.  Skin: Negative for wound.  Neurological: Negative for weakness and numbness.  All other systems reviewed and are negative.        Observations/Objective: No vital signs or physical exam, this was a telephone or virtual health encounter.  Pt alert and oriented, answers all questions appropriately, and able to speak in full sentences.    Assessment and Plan: Keyan was seen today for foot pain.  Diagnoses and all orders for this visit:  Plantar fasciitis, left Reported symptoms consistent with plantar fascitis. Symptomatic care discussed. Exercises for stretching discussed in detail. Will trial oral therapies. Good arch support in shoes. If symptoms do not improve, may need trigger point injection. Follow up in 4 - 6 weeks. Report any new or worsening symptoms.  -     naproxen (NAPROSYN) 500 MG tablet; Take 1 tablet (500 mg total)  by mouth 2 (two) times daily with a meal for 14 days. -     predniSONE (DELTASONE) 20 MG tablet; 2 po at sametime daily for 5 days     Follow Up Instructions: Return in about 6 weeks (around 09/19/2018), or if symptoms worsen or fail to improve, for foot pain.    I discussed the assessment and treatment plan with the patient. The patient was provided an opportunity to ask questions and all were answered. The patient agreed with the plan and demonstrated an understanding of the instructions.   The patient was advised to call back or seek an in-person evaluation if the symptoms worsen or if the condition fails to improve as anticipated.  The above assessment and management plan was discussed with the patient. The patient verbalized understanding of and has agreed to the management plan. Patient is aware to call the clinic if symptoms persist or worsen. Patient is aware when to return  to the clinic for a follow-up visit. Patient educated on when it is appropriate to go to the emergency department.    I provided 15 minutes of non-face-to-face time during this encounter. The call started at 0910. The call ended at 0925. The other time was used for coordination of care.    Kari BaarsMichelle Rakes, FNP-C [facility] [address] [place], [place] [address] [phone]

## 2018-08-09 ENCOUNTER — Telehealth: Payer: Self-pay | Admitting: Nurse Practitioner

## 2018-08-09 NOTE — Telephone Encounter (Signed)
Pt will try oral medication

## 2018-08-10 NOTE — Telephone Encounter (Signed)
Pt aware and has INR appt on 08/12/18

## 2018-08-10 NOTE — Telephone Encounter (Signed)
It can - we will just need to check INR Friday if he takes naprosyn

## 2018-08-11 ENCOUNTER — Other Ambulatory Visit: Payer: Self-pay

## 2018-08-12 ENCOUNTER — Ambulatory Visit (INDEPENDENT_AMBULATORY_CARE_PROVIDER_SITE_OTHER): Admitting: Family Medicine

## 2018-08-12 ENCOUNTER — Encounter: Payer: Self-pay | Admitting: Family Medicine

## 2018-08-12 VITALS — BP 135/88 | HR 67 | Temp 98.6°F | Ht 72.0 in | Wt 220.0 lb

## 2018-08-12 DIAGNOSIS — Z86711 Personal history of pulmonary embolism: Secondary | ICD-10-CM | POA: Diagnosis not present

## 2018-08-12 DIAGNOSIS — Z7901 Long term (current) use of anticoagulants: Secondary | ICD-10-CM | POA: Diagnosis not present

## 2018-08-12 LAB — POCT INR: INR: 2.6 (ref 2.0–3.0)

## 2018-08-12 LAB — COAGUCHEK XS/INR WAIVED
INR: 2.6 — ABNORMAL HIGH (ref 0.9–1.1)
Prothrombin Time: 30.8 s

## 2018-08-12 NOTE — Progress Notes (Signed)
Subjective:  Patient ID: Allen Watson, male    DOB: 06/24/1965, 53 y.o.   MRN: [id number]  Chief Complaint:  Anticoagulation   HPI: Allen Watson is a 53 y.o. male presenting on 08/12/2018 for Anticoagulation  Pt presents today for INR check. Pt was placed on a short course of NSAIDs and was concerned his INR would be altered. Pt denies abnormal bleeding or bruising. No dizziness, syncope, palpitations, chest pain, shortness of breath, or cough.   Relevant past medical, surgical, family, and social history reviewed and updated as indicated.  Allergies and medications reviewed and updated.   Past Medical History:  Diagnosis Date  . Pulmonary embolism [facility](HCC)     Past Surgical History:  Procedure Laterality Date  . SPLEENECTOMY  1990'S    Social History   Socioeconomic History  . Marital status: Married    Spouse name: Not on file  . Number of children: 2  . Years of education: Not on file  . Highest education level: Not on file  Occupational History  . Occupation: Recruitment consultantHelicopter Pilot  Social Needs  . Financial resource strain: Not on file  . Food insecurity:    Worry: Not on file    Inability: Not on file  . Transportation needs:    Medical: Not on file    Non-medical: Not on file  Tobacco Use  . Smoking status: Never Smoker  . Smokeless tobacco: Never Used  Substance and Sexual Activity  . Alcohol use: Yes    Comment: socially  . Drug use: No  . Sexual activity: Not on file  Lifestyle  . Physical activity:    Days per week: Not on file    Minutes per session: Not on file  . Stress: Not on file  Relationships  . Social connections:    Talks on phone: Not on file    Gets together: Not on file    Attends religious service: Not on file    Active member of club or organization: Not on file    Attends meetings of clubs or organizations: Not on file    Relationship status: Not on file  . Intimate partner violence:    Fear of current or ex partner: Not on  file    Emotionally abused: Not on file    Physically abused: Not on file    Forced sexual activity: Not on file  Other Topics Concern  . Not on file  Social History Narrative  . Not on file    Outpatient Encounter Medications as of 08/12/2018  Medication Sig  . calcium citrate-vitamin D (CITRACAL+D) 315-200 MG-UNIT per tablet Take 1 tablet by mouth 2 (two) times daily.  . Multiple Vitamin (MULTIVITAMIN WITH MINERALS) TABS tablet Take 1 tablet by mouth daily.  . naproxen (NAPROSYN) 500 MG tablet Take 1 tablet (500 mg total) by mouth 2 (two) times daily with a meal for 14 days.  . predniSONE (DELTASONE) 20 MG tablet 2 po at sametime daily for 5 days  . warfarin (COUMADIN) 5 MG tablet TAKE 2 TO 3 TABLETS DAILY AS DIRECTED BY ANTICOAGULATION CLINIC   No facility-administered encounter medications on file as of 08/12/2018.     No Known Allergies  Review of Systems  Constitutional: Negative for chills, fatigue and fever.  Respiratory: Negative for cough and shortness of breath.   Cardiovascular: Negative for chest pain and leg swelling.  Gastrointestinal: Negative for anal bleeding and blood in stool.  Genitourinary: Negative for hematuria.  Neurological: Negative  for dizziness, tremors, seizures, syncope, facial asymmetry, speech difficulty, weakness, light-headedness, numbness and headaches.  Hematological: Does not bruise/bleed easily.  Psychiatric/Behavioral: Negative for confusion.  All other systems reviewed and are negative.       Objective:  BP 135/88   Pulse 67   Temp 98.6 F (37 C) (Oral)   Ht 6' (1.829 m)   Wt 220 lb (99.8 kg)   BMI 29.84 kg/m    Wt Readings from Last 3 Encounters:  08/12/18 220 lb (99.8 kg)  05/19/18 210 lb (95.3 kg)  03/03/18 204 lb 6.4 oz (92.7 kg)    Physical Exam Vitals signs and nursing note reviewed.  Constitutional:      General: He is not in acute distress.    Appearance: Normal appearance. He is not ill-appearing or  toxic-appearing.  HENT:     Head: Normocephalic and atraumatic.     Mouth/Throat:     Mouth: Mucous membranes are moist.     Pharynx: Oropharynx is clear.  Eyes:     Conjunctiva/sclera: Conjunctivae normal.     Pupils: Pupils are equal, round, and reactive to light.  Cardiovascular:     Rate and Rhythm: Normal rate and regular rhythm.     Heart sounds: Normal heart sounds.  Pulmonary:     Effort: Pulmonary effort is normal. No respiratory distress.     Breath sounds: Normal breath sounds.  Skin:    General: Skin is warm and dry.     Capillary Refill: Capillary refill takes less than 2 seconds.     Findings: No bruising.  Neurological:     General: No focal deficit present.     Mental Status: He is alert and oriented to person, place, and time.  Psychiatric:        Mood and Affect: Mood normal.        Behavior: Behavior normal.        Thought Content: Thought content normal.        Judgment: Judgment normal.     Results for orders placed or performed in visit on 08/12/18  CoaguChek XS/INR Waived  Result Value Ref Range   INR 2.6 (H) 0.9 - 1.1   Prothrombin Time 30.8 sec  POCT INR  Result Value Ref Range   INR 2.6 2.0 - 3.0       Pertinent labs & imaging results that were available during my care of the patient were reviewed by me and considered in my medical decision making.  Assessment & Plan:  [name] was seen today for anticoagulation.  Diagnoses and all orders for this visit:  Chronic anticoagulation History of pulmonary embolus (PE) INR 2.6, at goal. Continue current regimen. Report any abnormal bruising or bleeding.  -     CoaguChek XS/INR Waived -     POCT INR       Continue all other maintenance medications.  Follow up plan: Return in about 5 weeks (around 09/16/2018), or if symptoms worsen or fail to improve, for INR.    The above assessment and management plan was discussed with the patient. The patient verbalized understanding of and has  agreed to the management plan. Patient is aware to call the clinic if symptoms persist or worsen. Patient is aware when to return to the clinic for a follow-up visit. Patient educated on when it is appropriate to go to the emergency department.   Kari Baars, FNP-C [facility] [phone]

## 2018-08-16 ENCOUNTER — Other Ambulatory Visit: Payer: Self-pay

## 2018-08-17 ENCOUNTER — Ambulatory Visit: Admitting: Pharmacist Clinician (PhC)/ Clinical Pharmacy Specialist

## 2018-08-17 DIAGNOSIS — Z7901 Long term (current) use of anticoagulants: Secondary | ICD-10-CM

## 2018-08-17 DIAGNOSIS — Z86711 Personal history of pulmonary embolism: Secondary | ICD-10-CM

## 2018-08-17 LAB — COAGUCHEK XS/INR WAIVED
INR: 4.1 — ABNORMAL HIGH (ref 0.9–1.1)
Prothrombin Time: 49.3 s

## 2018-08-17 NOTE — Patient Instructions (Signed)
Description   No warfarin today, then take 5 mg tomorrow and 10 mg Thursday and Friday, them Saturday resume your regular schedule.  Eat a high vitamin K food today and tomorrow.  INR today is 4.1 ( goal 2-3)  Too thin today

## 2018-08-23 ENCOUNTER — Other Ambulatory Visit: Payer: Self-pay

## 2018-08-23 ENCOUNTER — Telehealth: Payer: Self-pay | Admitting: Nurse Practitioner

## 2018-08-24 ENCOUNTER — Encounter: Payer: Self-pay | Admitting: Physician Assistant

## 2018-08-24 ENCOUNTER — Ambulatory Visit (INDEPENDENT_AMBULATORY_CARE_PROVIDER_SITE_OTHER): Admitting: Physician Assistant

## 2018-08-24 VITALS — BP 119/78 | HR 71 | Temp 98.0°F | Ht 72.0 in | Wt 216.0 lb

## 2018-08-24 DIAGNOSIS — Z7901 Long term (current) use of anticoagulants: Secondary | ICD-10-CM | POA: Diagnosis not present

## 2018-08-24 DIAGNOSIS — Z86711 Personal history of pulmonary embolism: Secondary | ICD-10-CM

## 2018-08-24 LAB — COAGUCHEK XS/INR WAIVED
INR: 2.2 — ABNORMAL HIGH (ref 0.9–1.1)
Prothrombin Time: 26.3 s

## 2018-08-24 NOTE — Progress Notes (Signed)
BP 119/78   Pulse 71   Temp 98 F (36.7 C) (Oral)   Ht 6' (1.829 m)   Wt 216 lb (98 kg)   BMI 29.29 kg/m    Subjective:    Patient ID: Allen Watson, male    DOB: 10/21/1965, 53 y.o.   MRN: [id number]  HPI: Allen Watson is a 53 y.o. male presenting on 08/24/2018 for INR check  08/24/18 INR 2.2  At his last visit he had an INR of 4.1, which was related to him having some oral antinflammatories from the podiatrist.  He was instructed to hold the medication 1 day only take 1 tablet daily and then to resume his normal dose which is 15 mg Monday and Friday and 10 mg all other days.  With today's numbers being back in proper range, I will tell him to resume your normal routine of 15 mg Monday Friday and 10 mg all other days and recheck in 4 weeks.    Call back sooner if you do go have the injection at the podiatrist.   Past Medical History:  Diagnosis Date  . Pulmonary embolism (HCC)    Relevant past medical, surgical, family and social history reviewed and updated as indicated. Interim medical history since our last visit reviewed. Allergies and medications reviewed and updated. DATA REVIEWED: CHART IN EPIC  Family History reviewed for pertinent findings.  Review of Systems  Constitutional: Negative.  Negative for appetite change and fatigue.  Eyes: Negative for pain and visual disturbance.  Respiratory: Negative.  Negative for cough, chest tightness, shortness of breath and wheezing.   Cardiovascular: Negative.  Negative for chest pain, palpitations and leg swelling.  Gastrointestinal: Negative.  Negative for abdominal pain, diarrhea, nausea and vomiting.  Genitourinary: Negative.   Skin: Negative.  Negative for color change and rash.  Neurological: Negative.  Negative for weakness, numbness and headaches.  Psychiatric/Behavioral: Negative.     Allergies as of 08/24/2018   No Known Allergies     Medication List       Accurate as of August 24, 2018  1:43 PM. If  you have any questions, ask your nurse or doctor.        STOP taking these medications   predniSONE 20 MG tablet Commonly known as: Deltasone Stopped by: Remus LofflerAngel S Libbey Duce, PA-C     TAKE these medications   calcium citrate-vitamin D 315-200 MG-UNIT tablet Commonly known as: CITRACAL+D Take 1 tablet by mouth 2 (two) times daily.   multivitamin with minerals Tabs tablet Take 1 tablet by mouth daily.   warfarin 5 MG tablet Commonly known as: COUMADIN Take as directed by the anticoagulation clinic. If you are unsure how to take this medication, talk to your nurse or doctor. Original instructions: TAKE 2 TO 3 TABLETS DAILY AS DIRECTED BY ANTICOAGULATION CLINIC          Objective:    BP 119/78   Pulse 71   Temp 98 F (36.7 C) (Oral)   Ht 6' (1.829 m)   Wt 216 lb (98 kg)   BMI 29.29 kg/m   No Known Allergies  Wt Readings from Last 3 Encounters:  08/24/18 216 lb (98 kg)  08/12/18 220 lb (99.8 kg)  05/19/18 210 lb (95.3 kg)    Physical Exam Constitutional:      Appearance: Normal appearance.  HENT:     Head: Normocephalic.  Cardiovascular:     Rate and Rhythm: Normal rate.  Pulmonary:  Effort: Pulmonary effort is normal.  Neurological:     Mental Status: He is alert.     Results for orders placed or performed in visit on 08/24/18  CoaguChek XS/INR Waived  Result Value Ref Range   INR 2.2 (H) 0.9 - 1.1   Prothrombin Time 26.3 sec      Assessment & Plan:   1. History of pulmonary embolus (PE) - CoaguChek XS/INR Waived  2. Chronic anticoagulation Resume 15 mg Monday and Friday, 10 mg all other days   Continue all other maintenance medications as listed above.  Follow up plan: Return in about 4 weeks (around 09/21/2018) for INR.  Educational handout given for instructions  Terald Sleeper PA-C [place] [address]  [place], [place] [address] [phone]   08/24/2018, 1:43 PM

## 2018-09-20 ENCOUNTER — Other Ambulatory Visit: Payer: Self-pay

## 2018-09-21 ENCOUNTER — Encounter: Payer: Self-pay | Admitting: Nurse Practitioner

## 2018-09-21 ENCOUNTER — Ambulatory Visit (INDEPENDENT_AMBULATORY_CARE_PROVIDER_SITE_OTHER): Admitting: Nurse Practitioner

## 2018-09-21 VITALS — BP 119/80 | HR 73 | Temp 97.3°F | Ht 72.0 in | Wt 211.0 lb

## 2018-09-21 DIAGNOSIS — Z7901 Long term (current) use of anticoagulants: Secondary | ICD-10-CM | POA: Diagnosis not present

## 2018-09-21 DIAGNOSIS — Z86711 Personal history of pulmonary embolism: Secondary | ICD-10-CM | POA: Diagnosis not present

## 2018-09-21 LAB — COAGUCHEK XS/INR WAIVED
INR: 2.4 — ABNORMAL HIGH (ref 0.9–1.1)
Prothrombin Time: 29.2 s

## 2018-09-21 NOTE — Progress Notes (Signed)
Subjective:   Chief Complaint: INR recheck   Indication: PE Bleeding signs/symptoms: None Thromboembolic signs/symptoms: None  Missed Coumadin doses: None Medication changes: no Dietary changes: no Bacterial/viral infection: no Other concerns: no  The following portions of the patient's history were reviewed and updated as appropriate: allergies, current medications, past family history, past medical history, past social history, past surgical history and problem list.  Review of Systems Pertinent items noted in HPI and remainder of comprehensive ROS otherwise negative.   Objective:    INR Today: 2.4 Current dose: coumadin 10mg  daily except 15mg  on mon and friday    Assessment:    Therapeutic INR for goal of 2-3   Plan:    1. New dose: no change   2. Next INR: 1 month    Allen Hassell Done, FNP

## 2018-10-24 ENCOUNTER — Encounter: Payer: Self-pay | Admitting: Nurse Practitioner

## 2018-10-24 ENCOUNTER — Ambulatory Visit (INDEPENDENT_AMBULATORY_CARE_PROVIDER_SITE_OTHER): Admitting: Nurse Practitioner

## 2018-10-24 DIAGNOSIS — Z86711 Personal history of pulmonary embolism: Secondary | ICD-10-CM | POA: Diagnosis not present

## 2018-10-24 DIAGNOSIS — Z7901 Long term (current) use of anticoagulants: Secondary | ICD-10-CM

## 2018-10-24 LAB — COAGUCHEK XS/INR WAIVED
INR: 2 — ABNORMAL HIGH (ref 0.9–1.1)
Prothrombin Time: 24.5 s

## 2018-11-21 NOTE — Progress Notes (Signed)
Subjective:   Chief Complaint: INR recheck  Indication: PE Bleeding signs/symptoms: None Thromboembolic signs/symptoms: None  Missed Coumadin doses: None Medication changes: no Dietary changes: no Bacterial/viral infection: no Other concerns: no  The following portions of the patient's history were reviewed and updated as appropriate: allergies, current medications, past family history, past medical history, past social history, past surgical history and problem list.  Review of Systems Pertinent items noted in HPI and remainder of comprehensive ROS otherwise negative.   Objective:    INR Today: 2.0 Current dose: coumadin 10mg  daily except 15mg  on  mondays    Assessment:    Therapeutic INR for goal of 2-3   Plan:    1. New dose: no change   2. Next INR: 1 month     [name] Hassell Done, FNP

## 2018-11-24 ENCOUNTER — Other Ambulatory Visit: Payer: Self-pay

## 2018-11-25 ENCOUNTER — Encounter: Payer: Self-pay | Admitting: Nurse Practitioner

## 2018-11-25 ENCOUNTER — Ambulatory Visit (INDEPENDENT_AMBULATORY_CARE_PROVIDER_SITE_OTHER): Admitting: Nurse Practitioner

## 2018-11-25 VITALS — BP 117/79 | HR 76 | Temp 98.7°F | Resp 16 | Ht 72.0 in | Wt 223.0 lb

## 2018-11-25 DIAGNOSIS — Z23 Encounter for immunization: Secondary | ICD-10-CM | POA: Diagnosis not present

## 2018-11-25 DIAGNOSIS — Z7901 Long term (current) use of anticoagulants: Secondary | ICD-10-CM | POA: Diagnosis not present

## 2018-11-25 DIAGNOSIS — Z86711 Personal history of pulmonary embolism: Secondary | ICD-10-CM

## 2018-11-25 LAB — COAGUCHEK XS/INR WAIVED
INR: 2.2 — ABNORMAL HIGH (ref 0.9–1.1)
Prothrombin Time: 26.1 s

## 2018-11-25 NOTE — Progress Notes (Signed)
Subjective:   Chief Complaint: INR recheck   Indication: PE Bleeding signs/symptoms: None Thromboembolic signs/symptoms: None  Missed Coumadin doses: None Medication changes: no Dietary changes: no Bacterial/viral infection: no Other concerns: no  The following portions of the patient's history were reviewed and updated as appropriate: allergies, current medications, past family history, past medical history, past social history, past surgical history and problem list.  Review of Systems Pertinent items noted in HPI and remainder of comprehensive ROS otherwise negative.   Objective:    INR Today: 2.2 Current dose: coumadin 10mg  daily except 15mg  on M and F    Assessment:    Therapeutic INR for goal of 2-3   Plan:    1. New dose: no change   2. Next INR: 1 month    [name] Hassell Done, FNP

## 2018-11-28 ENCOUNTER — Other Ambulatory Visit: Payer: Self-pay | Admitting: Nurse Practitioner

## 2018-11-28 DIAGNOSIS — Z86711 Personal history of pulmonary embolism: Secondary | ICD-10-CM

## 2018-12-29 ENCOUNTER — Other Ambulatory Visit: Payer: Self-pay

## 2018-12-30 ENCOUNTER — Encounter: Payer: Self-pay | Admitting: Nurse Practitioner

## 2018-12-30 ENCOUNTER — Ambulatory Visit (INDEPENDENT_AMBULATORY_CARE_PROVIDER_SITE_OTHER): Admitting: Nurse Practitioner

## 2018-12-30 ENCOUNTER — Other Ambulatory Visit: Payer: Self-pay

## 2018-12-30 VITALS — BP 117/79 | HR 67 | Temp 98.2°F | Resp 20 | Ht 72.0 in | Wt 222.0 lb

## 2018-12-30 DIAGNOSIS — Z86711 Personal history of pulmonary embolism: Secondary | ICD-10-CM

## 2018-12-30 DIAGNOSIS — Z7901 Long term (current) use of anticoagulants: Secondary | ICD-10-CM | POA: Diagnosis not present

## 2018-12-30 LAB — COAGUCHEK XS/INR WAIVED
INR: 1.8 — ABNORMAL HIGH (ref 0.9–1.1)
Prothrombin Time: 22.2 s

## 2018-12-30 NOTE — Progress Notes (Signed)
Subjective:   chief complaint : INR today    Indication: PE Bleeding signs/symptoms: None Thromboembolic signs/symptoms: None  Missed Coumadin doses: None Medication changes: no Dietary changes: no Bacterial/viral infection: no Other concerns: no  The following portions of the patient's history were reviewed and updated as appropriate: allergies, current medications, past family history, past medical history, past social history, past surgical history and problem list.  Review of Systems Pertinent items noted in HPI and remainder of comprehensive ROS otherwise negative.   Objective:    INR Today: 1.8 Current dose: coumadin 10mg  daily except 15mg  on M and F    Assessment:    Therapeutic INR for goal of 2-3   Plan:    1. New dose:  coumadin 20mg  today,1 Continue 10mg  daily except 15mg  Mon and fri 2. Next INR: 1 month    [name] Hassell Done, FNP

## 2019-01-27 ENCOUNTER — Encounter: Payer: Self-pay | Admitting: Nurse Practitioner

## 2019-01-27 ENCOUNTER — Ambulatory Visit (INDEPENDENT_AMBULATORY_CARE_PROVIDER_SITE_OTHER): Admitting: Nurse Practitioner

## 2019-01-27 ENCOUNTER — Other Ambulatory Visit: Payer: Self-pay

## 2019-01-27 VITALS — BP 126/82 | HR 67 | Temp 97.3°F | Resp 20 | Ht 72.0 in | Wt 225.0 lb

## 2019-01-27 DIAGNOSIS — Z7901 Long term (current) use of anticoagulants: Secondary | ICD-10-CM

## 2019-01-27 DIAGNOSIS — Z86711 Personal history of pulmonary embolism: Secondary | ICD-10-CM

## 2019-01-27 LAB — COAGUCHEK XS/INR WAIVED
INR: 2.7 — ABNORMAL HIGH (ref 0.9–1.1)
Prothrombin Time: 32.9 s

## 2019-01-27 NOTE — Progress Notes (Signed)
Subjective:   Chief Complaint: INR recheck   Indication: PE Bleeding signs/symptoms: None Thromboembolic signs/symptoms: None  Missed Coumadin doses: None Medication changes: no Dietary changes: no Bacterial/viral infection: no Other concerns: no  The following portions of the patient's history were reviewed and updated as appropriate: allergies, current medications, past family history, past medical history, past social history, past surgical history and problem list.  Review of Systems Pertinent items noted in HPI and remainder of comprehensive ROS otherwise negative.   Objective:    INR Today: 2.7 Current dose: coumadin 10mg  daily except 15mg  on M and F    Assessment:    Therapeutic INR for goal of 2-3   Plan:    1. New dose: no change   2. Next INR: 1 month    [name] Hassell Done, FNP

## 2019-02-20 ENCOUNTER — Ambulatory Visit (INDEPENDENT_AMBULATORY_CARE_PROVIDER_SITE_OTHER): Admitting: Family Medicine

## 2019-02-20 ENCOUNTER — Encounter: Payer: Self-pay | Admitting: Family Medicine

## 2019-02-20 ENCOUNTER — Other Ambulatory Visit: Payer: Self-pay

## 2019-02-20 VITALS — BP 122/81 | HR 78 | Temp 98.4°F | Resp 20 | Ht 72.0 in | Wt 231.0 lb

## 2019-02-20 DIAGNOSIS — Z86711 Personal history of pulmonary embolism: Secondary | ICD-10-CM

## 2019-02-20 DIAGNOSIS — Z7901 Long term (current) use of anticoagulants: Secondary | ICD-10-CM

## 2019-02-20 LAB — COAGUCHEK XS/INR WAIVED
INR: 3 — ABNORMAL HIGH (ref 0.9–1.1)
Prothrombin Time: 36.1 s

## 2019-02-20 NOTE — Progress Notes (Signed)
Subjective:  Patient ID: Allen Watson, male    DOB: [birth year]  Age: 53 y.o. MRN: [id number]  CC: Recheck protime   HPI Allen Watson presents for epistaxis overnight. Got it stopped with packing of kleenex. Then started again in the shower this morning. Packed again with resolution of bleed. None since.  Still has the packing in place. Left nare only.   Pulmonary embolism  follow up. Pt. is treated with anticoagulation. Pt.  denies palpitations, rapid rate, chest pain, dyspnea and edema. Pt. has not noticed blood with urine or stool.  Although there is routine bruising easily, it is not excessive. 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    Depression screen [facility] 2/9 02/20/2019 01/27/2019 12/30/2018  Decreased Interest 0 0 0  Down, Depressed, Hopeless 0 0 0  PHQ - 2 Score 0 0 0  Altered sleeping - - -  Tired, decreased energy - - -  Change in appetite - - -  Feeling bad or failure about yourself  - - -  Trouble concentrating - - -  Moving slowly or fidgety/restless - - -  Suicidal thoughts - - -  PHQ-9 Score - - -    History Allen Watson has a past medical history of Pulmonary embolism ([facility]).   He has a past surgical history that includes SPLEENECTOMY (1990'S).   His family history includes Bipolar disorder in his mother; Diabetes in his father; Lung cancer in his paternal uncle.He reports that he has never smoked. He has never used smokeless tobacco. He reports current alcohol use. He reports that he does not use drugs.    ROS Review of Systems  Constitutional: Negative for activity change and appetite change.  HENT: Positive for nosebleeds. Negative for congestion, hearing loss, postnasal drip and rhinorrhea.   Gastrointestinal: Negative for blood in stool.  Genitourinary: Negative for hematuria.  Hematological: Does not bruise/bleed easily.  Psychiatric/Behavioral: Negative.     Objective:  BP 122/81   Pulse 78   Temp 98.4 F (36.9 C)  (Temporal)   Resp 20   Ht 6' (1.829 m)   Wt 231 lb (104.8 kg)   SpO2 93%   BMI 31.33 kg/m   BP Readings from Last 3 Encounters:  02/20/19 122/81  01/27/19 126/82  12/30/18 117/79    Wt Readings from Last 3 Encounters:  02/20/19 231 lb (104.8 kg)  01/27/19 225 lb (102.1 kg)  12/30/18 222 lb (100.7 kg)     Physical Exam Vitals reviewed.  Constitutional:      General: He is not in acute distress.    Appearance: Normal appearance. He is not ill-appearing or diaphoretic.  HENT:     Head: Normocephalic.     Right Ear: External ear normal.     Left Ear: External ear normal.     Nose: Congestion (no blood or lesion visible anteriorly) present.     Mouth/Throat:     Mouth: Mucous membranes are moist.     Pharynx: Oropharynx is clear. No oropharyngeal exudate or posterior oropharyngeal erythema.  Cardiovascular:     Rate and Rhythm: Normal rate and regular rhythm.  Pulmonary:     Effort: Pulmonary effort is normal.     Breath sounds: Normal breath sounds.  Neurological:     General: No focal deficit present.     Mental Status: He is alert and oriented to person, place, and time.     Coordination: Coordination normal.  Psychiatric:  Mood and Affect: Mood normal.        Behavior: Behavior normal.        Thought Content: Thought content normal.    Results for orders placed or performed in visit on 02/20/19  inr FINGERSTICK  Result Value Ref Range   INR 3.0 (H) 0.9 - 1.1   Prothrombin Time 36.1 sec       Assessment & Plan:   Allen Watson was seen today for recheck protime.  Diagnoses and all orders for this visit:  Chronic anticoagulation -     inr FINGERSTICK  History of pulmonary embolus (PE)       I am having Liz Malady maintain his multivitamin with minerals, calcium citrate-vitamin D, and warfarin.  Allergies as of 02/20/2019   No Known Allergies     Medication List       Accurate as of February 20, 2019  3:05 PM. If you have any  questions, ask your nurse or doctor.        calcium citrate-vitamin D 315-200 MG-UNIT tablet Commonly known as: CITRACAL+D Take 1 tablet by mouth 2 (two) times daily.   multivitamin with minerals Tabs tablet Take 1 tablet by mouth daily.   warfarin 5 MG tablet Commonly known as: COUMADIN Take as directed by the anticoagulation clinic. If you are unsure how to take this medication, talk to your nurse or doctor. Original instructions: TAKE 2 TO 3 TABLETS DAILY AS DIRECTED BY ANTICOAGULATION CLINIC        Follow-up: as usual with Paulene Floor.  Mechele Claude, M.D.

## 2019-03-22 ENCOUNTER — Other Ambulatory Visit: Payer: Self-pay

## 2019-03-22 ENCOUNTER — Encounter: Payer: Self-pay | Admitting: Physician Assistant

## 2019-03-22 ENCOUNTER — Ambulatory Visit (INDEPENDENT_AMBULATORY_CARE_PROVIDER_SITE_OTHER): Admitting: Physician Assistant

## 2019-03-22 DIAGNOSIS — Z7901 Long term (current) use of anticoagulants: Secondary | ICD-10-CM

## 2019-03-22 DIAGNOSIS — Z86711 Personal history of pulmonary embolism: Secondary | ICD-10-CM

## 2019-03-22 LAB — COAGUCHEK XS/INR WAIVED
INR: 2.3 — ABNORMAL HIGH (ref 0.9–1.1)
Prothrombin Time: 27.6 s

## 2019-03-22 NOTE — Patient Instructions (Signed)
03/22/19 INR 2.3 Continue 10mg  daily except 15mg  Mon and fri

## 2019-03-22 NOTE — Progress Notes (Signed)
BP 122/87   Pulse 84   Temp 97.9 F (36.6 C) (Temporal)   Ht 6' (1.829 m)   Wt 232 lb 12.8 oz (105.6 kg)   SpO2 96%   BMI 31.57 kg/m    Subjective:    Patient ID: Allen Watson, male    DOB: 1966-02-16, 54 y.o.   MRN: [id number]  HPI: Allen Watson is a 54 y.o. male presenting on 03/22/2019 for Anticoagulation  This patient comes in for an anticoagulation visit.     Indication: PE Bleeding signs/symptoms: None Thromboembolic signs/symptoms: None  Missed Coumadin doses: None Medication changes: no Dietary changes: no Bacterial/viral infection: no Other concerns: no  03/22/19 INR 2.3 Continue 10mg  daily except 15mg  Mon and FRI  Past Medical History:  Diagnosis Date  . Pulmonary embolism (HCC)    Relevant past medical, surgical, family and social history reviewed and updated as indicated. Interim medical history since our last visit reviewed. Allergies and medications reviewed and updated. DATA REVIEWED: CHART IN EPIC  Family History reviewed for pertinent findings.  Review of Systems  Constitutional: Negative.  Negative for appetite change and fatigue.  Eyes: Negative for pain and visual disturbance.  Respiratory: Negative.  Negative for cough, chest tightness, shortness of breath and wheezing.   Cardiovascular: Negative.  Negative for chest pain, palpitations and leg swelling.  Gastrointestinal: Negative.  Negative for abdominal pain, diarrhea, nausea and vomiting.  Genitourinary: Negative.   Skin: Negative.  Negative for color change and rash.  Neurological: Negative.  Negative for weakness, numbness and headaches.  Psychiatric/Behavioral: Negative.     Allergies as of 03/22/2019   No Known Allergies     Medication List       Accurate as of March 22, 2019  5:40 PM. If you have any questions, ask your nurse or doctor.        calcium citrate-vitamin D 315-200 MG-UNIT tablet Commonly known as: CITRACAL+D Take 1 tablet by mouth 2 (two) times  daily.   multivitamin with minerals Tabs tablet Take 1 tablet by mouth daily.   warfarin 5 MG tablet Commonly known as: COUMADIN Take as directed by the anticoagulation clinic. If you are unsure how to take this medication, talk to your nurse or doctor. Original instructions: TAKE 2 TO 3 TABLETS DAILY AS DIRECTED BY ANTICOAGULATION CLINIC          Objective:    BP 122/87   Pulse 84   Temp 97.9 F (36.6 C) (Temporal)   Ht 6' (1.829 m)   Wt 232 lb 12.8 oz (105.6 kg)   SpO2 96%   BMI 31.57 kg/m   No Known Allergies  Wt Readings from Last 3 Encounters:  03/22/19 232 lb 12.8 oz (105.6 kg)  02/20/19 231 lb (104.8 kg)  01/27/19 225 lb (102.1 kg)    Physical Exam  Results for orders placed or performed in visit on 03/22/19  inr FINGERSTICK  Result Value Ref Range   INR 2.3 (H) 0.9 - 1.1   Prothrombin Time 27.6 sec      Assessment & Plan:   1. History of pulmonary embolus (PE) 03/22/19 INR 2.3 Continue 10mg  daily except 15mg  Mon and FRI  2. Chronic anticoagulation See above - inr FINGERSTICK   Continue all other maintenance medications as listed above.  Follow up plan: Return in about 4 weeks (around 04/19/2019) for MMMartin .  Educational handout given for 03/22/19 INR 2.3 Continue 10mg  daily except 15mg  Mon and FRI   PA-C [place] [address]  [place],  [address] [phone]   03/22/2019, 5:40 PM

## 2019-03-24 ENCOUNTER — Ambulatory Visit: Payer: Self-pay | Admitting: Nurse Practitioner

## 2019-04-21 ENCOUNTER — Ambulatory Visit (INDEPENDENT_AMBULATORY_CARE_PROVIDER_SITE_OTHER): Admitting: Nurse Practitioner

## 2019-04-21 ENCOUNTER — Encounter: Payer: Self-pay | Admitting: Nurse Practitioner

## 2019-04-21 DIAGNOSIS — Z7901 Long term (current) use of anticoagulants: Secondary | ICD-10-CM

## 2019-04-21 DIAGNOSIS — Z86711 Personal history of pulmonary embolism: Secondary | ICD-10-CM | POA: Diagnosis not present

## 2019-04-21 LAB — COAGUCHEK XS/INR WAIVED
INR: 2.1 — ABNORMAL HIGH (ref 0.9–1.1)
Prothrombin Time: 25.2 s

## 2019-04-21 NOTE — Progress Notes (Signed)
Subjective:   Chief Complaint: INR recheck   Indication: pulmonar emboli Bleeding signs/symptoms: None Thromboembolic signs/symptoms: None  Missed Coumadin doses: None Medication changes: no Dietary changes: no Bacterial/viral infection: no Other concerns: no  The following portions of the patient's history were reviewed and updated as appropriate: allergies, current medications, past family history, past social history, past surgical history and problem list.  Review of Systems Pertinent items noted in HPI and remainder of comprehensive ROS otherwise negative.   Objective:    INR Today: 2.1 Current dose: coumadin 10mg  daily except  15mg  mon and fri.    Assessment:    Therapeutic INR for goal of 2-3   Plan:    1. New dose: no change   2. Next INR: 1 month    [name] , FNP

## 2019-05-11 ENCOUNTER — Other Ambulatory Visit: Payer: Self-pay

## 2019-05-12 ENCOUNTER — Ambulatory Visit (INDEPENDENT_AMBULATORY_CARE_PROVIDER_SITE_OTHER): Admitting: Nurse Practitioner

## 2019-05-12 ENCOUNTER — Encounter: Payer: Self-pay | Admitting: Nurse Practitioner

## 2019-05-12 DIAGNOSIS — Z86711 Personal history of pulmonary embolism: Secondary | ICD-10-CM | POA: Diagnosis not present

## 2019-05-12 DIAGNOSIS — Z7901 Long term (current) use of anticoagulants: Secondary | ICD-10-CM | POA: Diagnosis not present

## 2019-05-12 LAB — COAGUCHEK XS/INR WAIVED
INR: 2.8 — ABNORMAL HIGH (ref 0.9–1.1)
Prothrombin Time: 33 s

## 2019-05-12 NOTE — Progress Notes (Signed)
Subjective:   Chief complaint: INR recheck   Indication: DVT and PE Bleeding signs/symptoms: None Thromboembolic signs/symptoms: None  Missed Coumadin doses: None Medication changes: no Dietary changes: no Bacterial/viral infection: no Other concerns: no  The following portions of the patient's history were reviewed and updated as appropriate: allergies, current medications, past family history, past medical history, past social history, past surgical history and problem list.  Review of Systems Pertinent items noted in HPI and remainder of comprehensive ROS otherwise negative.   Objective:    INR Today: 2.8 Current dose: coumadin 10mg  daily except 15mg  on Monday and fri     Assessment:    Therapeutic INR for goal of 2-3   Plan:    1. New dose: no change   2. Next INR: 1 month    [name] , FNP

## 2019-05-19 ENCOUNTER — Ambulatory Visit: Payer: Self-pay | Admitting: Nurse Practitioner

## 2019-06-13 ENCOUNTER — Ambulatory Visit: Payer: Self-pay | Admitting: Nurse Practitioner

## 2019-06-27 ENCOUNTER — Other Ambulatory Visit: Payer: Self-pay

## 2019-06-27 ENCOUNTER — Encounter: Payer: Self-pay | Admitting: Nurse Practitioner

## 2019-06-27 ENCOUNTER — Ambulatory Visit (INDEPENDENT_AMBULATORY_CARE_PROVIDER_SITE_OTHER): Admitting: Nurse Practitioner

## 2019-06-27 VITALS — BP 122/76 | HR 74 | Temp 97.9°F | Resp 20 | Ht 72.0 in | Wt 232.0 lb

## 2019-06-27 DIAGNOSIS — Z7901 Long term (current) use of anticoagulants: Secondary | ICD-10-CM | POA: Diagnosis not present

## 2019-06-27 DIAGNOSIS — R251 Tremor, unspecified: Secondary | ICD-10-CM

## 2019-06-27 DIAGNOSIS — Z86711 Personal history of pulmonary embolism: Secondary | ICD-10-CM | POA: Diagnosis not present

## 2019-06-27 LAB — COAGUCHEK XS/INR WAIVED
INR: 2.2 — ABNORMAL HIGH (ref 0.9–1.1)
Prothrombin Time: 26.6 s

## 2019-06-27 NOTE — Progress Notes (Signed)
Subjective:   chief Complaint: INR recheck   Indication: PE Bleeding signs/symptoms: None Thromboembolic signs/symptoms: None  Missed Coumadin doses: None Medication changes: no Dietary changes: no Bacterial/viral infection: no Other concerns: no  The following portions of the patient's history were reviewed and updated as appropriate: allergies, current medications, past family history, past medical history, past social history, past surgical history and problem list.  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 Review of Systems Pertinent items noted in HPI and remainder of comprehensive ROS otherwise negative.   Objective:    INR Today: 2.2 Current dose: coumadin 10mg  daily except 15mg  Monday and friday    Assessment:    Therapeutic INR for goal of 2-3   Plan:    1. New dose: no change   2. Next INR: 1 month     [name] , FNP

## 2019-07-28 ENCOUNTER — Encounter: Payer: Self-pay | Admitting: Nurse Practitioner

## 2019-07-28 ENCOUNTER — Ambulatory Visit (INDEPENDENT_AMBULATORY_CARE_PROVIDER_SITE_OTHER): Admitting: Nurse Practitioner

## 2019-07-28 ENCOUNTER — Other Ambulatory Visit: Payer: Self-pay

## 2019-07-28 DIAGNOSIS — Z86711 Personal history of pulmonary embolism: Secondary | ICD-10-CM

## 2019-07-28 DIAGNOSIS — Z7901 Long term (current) use of anticoagulants: Secondary | ICD-10-CM

## 2019-07-28 LAB — COAGUCHEK XS/INR WAIVED
INR: 1.9 — ABNORMAL HIGH (ref 0.9–1.1)
Prothrombin Time: 22.3 s

## 2019-07-28 NOTE — Addendum Note (Signed)
Addended by: Cleda Daub on: 07/28/2019 12:01 PM   Modules accepted: Orders

## 2019-07-28 NOTE — Progress Notes (Signed)
Subjective:   Chief Complaint: INR recheck   Indication: PE Bleeding signs/symptoms: None Thromboembolic signs/symptoms: None  Missed Coumadin doses: None Medication changes: no Dietary changes: no Bacterial/viral infection: no Other concerns: no  The following portions of the patient's history were reviewed and updated as appropriate: allergies, current medications, past family history, past medical history, past social history, past surgical history and problem list.  Review of Systems Pertinent items noted in HPI and remainder of comprehensive ROS otherwise negative.   Objective:    INR Today: 1.9 Current dose: coumadin 10mg  daily except 15mg  on Mon and fri     Assessment:    Subtherapeutic INR for goal of 2-3   Plan:    1. New dose: take 15 mg thi aturday then back to normal dose of 10mg  daily except 15mg  on mom and fri. 2. Next INR: 1 month    [name] Tue, FNP

## 2019-08-04 ENCOUNTER — Encounter: Payer: Self-pay | Admitting: Neurology

## 2019-08-04 ENCOUNTER — Other Ambulatory Visit: Payer: Self-pay

## 2019-08-04 ENCOUNTER — Ambulatory Visit (INDEPENDENT_AMBULATORY_CARE_PROVIDER_SITE_OTHER): Admitting: Neurology

## 2019-08-04 VITALS — BP 143/96 | HR 71 | Ht 72.0 in | Wt 234.0 lb

## 2019-08-04 DIAGNOSIS — G250 Essential tremor: Secondary | ICD-10-CM

## 2019-08-04 NOTE — Progress Notes (Signed)
PATIENT: Allen Watson DOB: 10-Nov-1965  Chief Complaint  Patient presents with  . 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  I have reviewed and summarized the referring note from the referring physician.  He is on chronic Coumadin treatment for history of PE happened in 2008, presented with chest pain then, likely PE in 2004.  Most recent INR was 1.9,  He reported a history of bilateral tremor as long as he could remember, even dating back to his high school years, gradually become noticeable over the years, but no limitation in his daily function, he has bad handwriting, occasionally hand shaking would spill his cups, he denies loss sense of smell, no REM sleep disorder, no gait abnormality  His father suffered similar tremor  Laboratory evaluation in 2021, INR was 1.9, normal CMP, glucose of 122, lipid panel cholesterol 220, LDL 135   REVIEW OF SYSTEMS: Full 14 system review of systems performed and notable only for as above All other review of systems were negative.  ALLERGIES: No Known Allergies  HOME MEDICATIONS: Current Outpatient Medications  Medication Sig Dispense Refill  . calcium citrate-vitamin D (CITRACAL+D) 315-200 MG-UNIT per tablet Take 1 tablet by mouth 2 (two) times daily.    . Multiple Vitamin (MULTIVITAMIN WITH MINERALS) TABS tablet Take 1 tablet by mouth daily.    Marland Kitchen warfarin (COUMADIN) 5 MG tablet TAKE 2 TO 3 TABLETS DAILY AS DIRECTED BY ANTICOAGULATION CLINIC 270 tablet 3   No current facility-administered medications for this visit.    PAST MEDICAL HISTORY: Past Medical History:  Diagnosis Date  . Pulmonary embolism  ([facility])     PAST SURGICAL HISTORY: Past Surgical History:  Procedure Laterality Date  . SPLEENECTOMY  1990'S    FAMILY HISTORY: Family History  Problem Relation Age of Onset  . Bipolar disorder Mother   . Diabetes Father   . Lung cancer Paternal Uncle        x 2 uncles  . Colon cancer Neg Hx   . Stomach cancer Neg Hx   . Rectal cancer Neg Hx   . Esophageal cancer Neg Hx   . Liver cancer Neg Hx     SOCIAL HISTORY: Social History   Socioeconomic History  . Marital status: Married    Spouse name: Not on file  . Number of children: 2  . Years of education: Not on file  . Highest education level: Not on file  Occupational History  . Occupation: Dietitian  Tobacco Use  . Smoking status: Never Smoker  . Smokeless tobacco: Never Used  Substance and Sexual Activity  . Alcohol use: Yes    Comment: socially  . Drug use: No  . Sexual activity: Not on file  Other Topics Concern  . Not on file  Social History Narrative  . Not on file   Social Determinants of Health   Financial Resource Strain:   . Difficulty of Paying Living Expenses:   Food Insecurity:   . Worried About Charity fundraiser in the Last Year:   . Arboriculturist in the Last Year:   Transportation Needs:   .  Lack of Transportation (Medical):   Marland Kitchen Lack of Transportation (Non-Medical):   Physical Activity:   . Days of Exercise per Week:   . Minutes of Exercise per Session:   Stress:   . Feeling of Stress :   Social Connections:   . Frequency of Communication with Friends and Family:   . Frequency of Social Gatherings with Friends and Family:   . Attends Religious Services:   . Active Member of Clubs or Organizations:   . Attends Banker Meetings:   Marland Kitchen Marital Status:   Intimate Partner Violence:   . Fear of Current or Ex-Partner:   . Emotionally Abused:   Marland Kitchen Physically Abused:   . Sexually Abused:      PHYSICAL EXAM   Vitals:   08/04/19 0924  BP: (!) 143/96  Pulse: 71   Weight: 234 lb (106.1 kg)  Height: 6' (1.829 m)    Not recorded      Body mass index is 31.74 kg/m.  PHYSICAL EXAMNIATION:  Gen: NAD, conversant, well nourised, well groomed                     Cardiovascular: Regular rate rhythm, no peripheral edema, warm, nontender. Eyes: Conjunctivae clear without exudates or hemorrhage Neck: Supple, no carotid bruits. Pulmonary: Clear to auscultation bilaterally   NEUROLOGICAL EXAM:  MENTAL STATUS: Speech:    Speech is normal; fluent and spontaneous with normal comprehension.  Cognition:     Orientation to time, place and person     Normal recent and remote memory     Normal Attention span and concentration     Normal Language, naming, repeating,spontaneous speech     Fund of knowledge   CRANIAL NERVES: CN II: Visual fields are full to confrontation. Pupils are round equal and briskly reactive to light. CN III, IV, VI: extraocular movement are normal. No ptosis. CN V: Facial sensation is intact to light touch CN VII: Face is symmetric with normal eye closure  CN VIII: Hearing is normal to causal conversation. CN IX, X: Phonation is normal. CN XI: Head turning and shoulder shrug are intact  MOTOR: He has mild bilateral hands posturing tremor, muscle bulk and tone are normal. Muscle strength is normal.  REFLEXES: Reflexes are 2+ and symmetric at the biceps, triceps, knees, and ankles. Plantar responses are flexor.  SENSORY: Intact to light touch, pinprick and vibratory sensation are intact in fingers and toes.  COORDINATION: There is no trunk or limb dysmetria noted.  GAIT/STANCE: Posture is normal. Gait is steady with normal steps, base, arm swing, and turning. Heel and toe walking are normal. Tandem gait is normal.  Romberg is absent.   DIAGNOSTIC DATA (LABS, IMAGING, TESTING) - I reviewed patient records, labs, notes, testing and imaging myself where available.   ASSESSMENT AND PLAN  Allen Watson is a 54 y.o.  male   Familial essential tremor  That is confirmed by his longstanding history, strong family history, mild postural tremor on examinations  Laboratory evaluation including TSH to rule out treatable etiology  Letter to his employee was written,  Only return to clinic for new issues   Levert Feinstein, M.D. Ph.D.  [facility] [address] [place], [place] [address] Ph: [phone] Fax: [phone]  CC: [name], FNP

## 2019-08-05 LAB — COMPREHENSIVE METABOLIC PANEL
ALT: 14 IU/L (ref 0–44)
AST: 20 IU/L (ref 0–40)
Albumin/Globulin Ratio: 1.2 (ref 1.2–2.2)
Albumin: 4.5 g/dL (ref 3.8–4.9)
Alkaline Phosphatase: 77 IU/L (ref 48–121)
BUN/Creatinine Ratio: 19 (ref 9–20)
BUN: 16 mg/dL (ref 6–24)
Bilirubin Total: 0.2 mg/dL (ref 0.0–1.2)
CO2: 21 mmol/L (ref 20–29)
Calcium: 9.9 mg/dL (ref 8.7–10.2)
Chloride: 103 mmol/L (ref 96–106)
Creatinine, Ser: 0.83 mg/dL (ref 0.76–1.27)
GFR calc Af Amer: 116 mL/min/{1.73_m2} (ref >59)
GFR calc non Af Amer: 100 mL/min/{1.73_m2} (ref >59)
Globulin, Total: 3.7 g/dL (ref 1.5–4.5)
Glucose: 86 mg/dL (ref 65–99)
Potassium: 4.6 mmol/L (ref 3.5–5.2)
Sodium: 137 mmol/L (ref 134–144)
Total Protein: 8.2 g/dL (ref 6.0–8.5)

## 2019-08-05 LAB — TSH: TSH: 2.13 u[IU]/mL (ref 0.450–4.500)

## 2019-08-05 LAB — CBC WITH DIFFERENTIAL
Basophils Absolute: 0.1 10*3/uL (ref 0.0–0.2)
Basos: 1 %
EOS (ABSOLUTE): 0.1 10*3/uL (ref 0.0–0.4)
Eos: 1 %
Hematocrit: 47.9 % (ref 37.5–51.0)
Hemoglobin: 16.2 g/dL (ref 13.0–17.7)
Immature Grans (Abs): 0 10*3/uL (ref 0.0–0.1)
Immature Granulocytes: 0 %
Lymphocytes Absolute: 2.9 10*3/uL (ref 0.7–3.1)
Lymphs: 32 %
MCH: 30.1 pg (ref 26.6–33.0)
MCHC: 33.8 g/dL (ref 31.5–35.7)
MCV: 89 fL (ref 79–97)
Monocytes Absolute: 0.9 10*3/uL (ref 0.1–0.9)
Monocytes: 10 %
Neutrophils Absolute: 5 10*3/uL (ref 1.4–7.0)
Neutrophils: 56 %
RBC: 5.38 x10E6/uL (ref 4.14–5.80)
RDW: 13.6 % (ref 11.6–15.4)
WBC: 9.1 10*3/uL (ref 3.4–10.8)

## 2019-08-25 ENCOUNTER — Other Ambulatory Visit: Payer: Self-pay

## 2019-08-25 ENCOUNTER — Encounter: Payer: Self-pay | Admitting: Family

## 2019-08-25 ENCOUNTER — Ambulatory Visit (INDEPENDENT_AMBULATORY_CARE_PROVIDER_SITE_OTHER): Admitting: Family

## 2019-08-25 VITALS — BP 136/84 | HR 95 | Temp 98.1°F | Ht 72.0 in | Wt 234.6 lb

## 2019-08-25 DIAGNOSIS — Z86711 Personal history of pulmonary embolism: Secondary | ICD-10-CM | POA: Diagnosis not present

## 2019-08-25 DIAGNOSIS — Z7901 Long term (current) use of anticoagulants: Secondary | ICD-10-CM

## 2019-08-25 LAB — COAGUCHEK XS/INR WAIVED
INR: 2.1 — ABNORMAL HIGH (ref 0.9–1.1)
Prothrombin Time: 24.8 s

## 2019-08-25 NOTE — Patient Instructions (Signed)
Description   Goal 2-3 INR 2.1 (Perfect!)  Continue 10mg  daily except 15mg  Monday and Friday

## 2019-08-25 NOTE — Progress Notes (Signed)
   Subjective:    Patient ID: Allen Watson, male    DOB: 1966-02-27, 54 y.o.   MRN: [id number]  Chief Complaint  Patient presents with  . Coagulation Disorder    HPI Pt presents to the office today for INR. He is currently taking warfarin for hx of PE. He denies any medication changes, missed doses, bleeding, or bruising. See anticoagulation flow sheet.    Review of Systems  All other systems reviewed and are negative.      Objective:   Physical Exam Vitals reviewed.  Constitutional:      General: He is not in acute distress.    Appearance: He is well-developed.  HENT:     Head: Normocephalic.     Right Ear: Tympanic membrane normal.     Left Ear: Tympanic membrane normal.  Eyes:     General:        Right eye: No discharge.        Left eye: No discharge.     Pupils: Pupils are equal, round, and reactive to light.  Neck:     Thyroid: No thyromegaly.  Cardiovascular:     Rate and Rhythm: Normal rate and regular rhythm.     Heart sounds: Normal heart sounds. No murmur heard.   Pulmonary:     Effort: Pulmonary effort is normal. No respiratory distress.     Breath sounds: Normal breath sounds. No wheezing.  Abdominal:     General: Bowel sounds are normal. There is no distension.     Palpations: Abdomen is soft.     Tenderness: There is no abdominal tenderness.  Musculoskeletal:        General: No tenderness. Normal range of motion.     Cervical back: Normal range of motion and neck supple.  Skin:    General: Skin is warm and dry.     Findings: No erythema or rash.  Neurological:     Mental Status: He is alert and oriented to person, place, and time.     Cranial Nerves: No cranial nerve deficit.     Deep Tendon Reflexes: Reflexes are normal and symmetric.  Psychiatric:        Behavior: Behavior normal.        Thought Content: Thought content normal.        Judgment: Judgment normal.      BP 136/84   Pulse 95   Temp 98.1 F (36.7 C) (Temporal)   Ht 6'  (1.829 m)   Wt 234 lb 9.6 oz (106.4 kg)   SpO2 94%   BMI 31.82 kg/m       Assessment & Plan:  Allen Watson comes in today with chief complaint of Coagulation Disorder   Diagnosis and orders addressed:  1. History of pulmonary embolus (PE) - CoaguChek XS/INR Waived  2. Chronic anticoagulation Description   Goal 2-3 INR 2.1 (Perfect!)  Continue 10mg  daily except 15mg  Monday and Friday        Follow up plan: 1 month   Sunday, FNP

## 2019-08-28 ENCOUNTER — Ambulatory Visit: Payer: Self-pay | Admitting: Nurse Practitioner

## 2019-09-06 ENCOUNTER — Ambulatory Visit (INDEPENDENT_AMBULATORY_CARE_PROVIDER_SITE_OTHER): Admitting: Family Medicine

## 2019-09-06 VITALS — HR 94 | Temp 100.2°F

## 2019-09-06 DIAGNOSIS — J069 Acute upper respiratory infection, unspecified: Secondary | ICD-10-CM

## 2019-09-06 DIAGNOSIS — K219 Gastro-esophageal reflux disease without esophagitis: Secondary | ICD-10-CM

## 2019-09-06 MED ORDER — BENZONATATE 100 MG PO CAPS: 100.0000 mg | ORAL_CAPSULE | Freq: Three times a day (TID) | ORAL | 0 refills | Status: DC | PRN

## 2019-09-06 MED ORDER — FLUTICASONE PROPIONATE 50 MCG/ACT NA SUSP: 2.0000 | Freq: Every day | NASAL | 6 refills | Status: DC

## 2019-09-06 MED ORDER — GUAIFENESIN-CODEINE 100-10 MG/5ML PO SOLN: 5.0000 mL | ORAL | 0 refills | Status: DC | PRN

## 2019-09-06 MED ORDER — AMOXICILLIN-POT CLAVULANATE 875-125 MG PO TABS: 1.0000 | ORAL_TABLET | Freq: Two times a day (BID) | ORAL | 0 refills | Status: DC

## 2019-09-06 NOTE — Patient Instructions (Signed)

## 2019-09-06 NOTE — Progress Notes (Signed)
Telephone visit  Subjective: CC: URI PCP: [name], FNP [name] is a 54 y.o. male calls for telephone consult today. Patient provides verbal consent for consult held via phone.  Due to COVID-19 pandemic this visit was conducted virtually. This visit type was conducted due to national recommendations for restrictions regarding the COVID-19 Pandemic (e.g. social distancing, sheltering in place) in an effort to limit this patient's exposure and mitigate transmission in our community. All issues noted in this document were discussed and addressed.  A physical exam was not performed with this format.   Location of patient: home Location of provider: [facility] Others present for call: wife  1. URI Patient reports recent trip to [place], [place].  He notes typically he feels poorly when he gets back.  He has been having congestion, otalgia, occasionally yellow productive cough (worse in am).  Symptoms are worse at night.  No known fever.  He has had increased acid reflux.  He's been trying to use steam to open his sinuses.  He has been using Tylenol, mucinex, cough drops.  He is a Occupational hygienist.  Has been vaccinated against COVID.  No known sick contacts.   ROS: Per HPI  No Known Allergies Past Medical History:  Diagnosis Date  . Pulmonary embolism ([facility])     Current Outpatient Medications:  .  calcium citrate-vitamin D (CITRACAL+D) 315-200 MG-UNIT per tablet, Take 1 tablet by mouth 2 (two) times daily., Disp: , Rfl:  .  Multiple Vitamin (MULTIVITAMIN WITH MINERALS) TABS tablet, Take 1 tablet by mouth daily., Disp: , Rfl:  .  warfarin (COUMADIN) 5 MG tablet, TAKE 2 TO 3 TABLETS DAILY AS DIRECTED BY ANTICOAGULATION CLINIC, Disp: 270 tablet, Rfl: 3  Assessment/ Plan: 54 y.o. male   1. URI with cough and congestion I have given him medications to help with what appears to be a viral URI.  I have also given him a pocket prescription for Augmentin since we are going into the holiday  weekend soon.  We discussed threshold to start the medication and reasons for emergent evaluation.  He voiced good understanding.  He will follow-up as needed on this issue - benzonatate (TESSALON PERLES) 100 MG capsule; Take 1 capsule (100 mg total) by mouth 3 (three) times daily as needed.  Dispense: 20 capsule; Refill: 0 - guaiFENesin-codeine 100-10 MG/5ML syrup; Take 5 mLs by mouth every 4 (four) hours as needed for cough ((sedating)).  Dispense: 120 mL; Refill: 0 - amoxicillin-clavulanate (AUGMENTIN) 875-125 MG tablet; Take 1 tablet by mouth 2 (two) times daily. (start Friday if not feeling better)  Dispense: 20 tablet; Refill: 0 - fluticasone (FLONASE) 50 MCG/ACT nasal spray; Place 2 sprays into both nostrils daily.  Dispense: 16 g; Refill: 6  2. Gastroesophageal reflux disease without esophagitis Somewhat limited by use of warfarin.  However, if symptoms are severe, could consider use of Tums.  The Narcotic Database has been reviewed.  There were no red flags.    Start time: 4:21pm End time: 4:28pm  Total time spent on patient care (including telephone call/ virtual visit): 11 minutes  Allen Watson, Allen Watson [facility] [phone]

## 2019-09-15 ENCOUNTER — Telehealth: Payer: Self-pay | Admitting: Nurse Practitioner

## 2019-09-15 NOTE — Telephone Encounter (Signed)
FYI

## 2019-09-15 NOTE — Telephone Encounter (Signed)
Pt going to start amoxicillin-clavulanate (AUGMENTIN) 875-125 MG tablet because sxs are not better. He wants to let [facility] know.

## 2019-10-03 ENCOUNTER — Ambulatory Visit: Admitting: Nurse Practitioner

## 2019-10-03 ENCOUNTER — Encounter: Payer: Self-pay | Admitting: Nurse Practitioner

## 2019-10-03 ENCOUNTER — Other Ambulatory Visit: Payer: Self-pay

## 2019-10-03 VITALS — BP 122/82 | HR 70 | Temp 98.0°F | Resp 20 | Ht 72.0 in | Wt 226.0 lb

## 2019-10-03 DIAGNOSIS — Z86711 Personal history of pulmonary embolism: Secondary | ICD-10-CM

## 2019-10-03 DIAGNOSIS — Z7901 Long term (current) use of anticoagulants: Secondary | ICD-10-CM

## 2019-10-03 LAB — COAGUCHEK XS/INR WAIVED
INR: 2.4 — ABNORMAL HIGH (ref 0.9–1.1)
Prothrombin Time: 29 s

## 2019-10-03 NOTE — Progress Notes (Signed)
Subjective:   Chief Complaint: INR recheck   Indication: PE Bleeding signs/symptoms: None Thromboembolic signs/symptoms: None  Missed Coumadin doses: None Medication changes: no Dietary changes: no Bacterial/viral infection: no Other concerns: no  The following portions of the patient's history were reviewed and updated as appropriate: allergies, current medications, past family history, past medical history, past social history, past surgical history and problem list.  Review of Systems Pertinent items noted in HPI and remainder of comprehensive ROS otherwise negative.   Objective:    INR Today: 2.4 Current dose: Coumadin 10mg  daily except 5mg  on Monday and friday    Assessment:    Therapeutic INR for goal of 2-3   Plan:    1. New dose: no change   2. Next INR: 1 month    [name] , FNP

## 2019-10-13 ENCOUNTER — Other Ambulatory Visit: Payer: Self-pay | Admitting: *Deleted

## 2019-10-13 DIAGNOSIS — Z86711 Personal history of pulmonary embolism: Secondary | ICD-10-CM

## 2019-10-13 MED ORDER — WARFARIN SODIUM 5 MG PO TABS: ORAL_TABLET | ORAL | 0 refills | Status: DC

## 2019-11-03 ENCOUNTER — Encounter: Payer: Self-pay | Admitting: Nurse Practitioner

## 2019-11-03 ENCOUNTER — Other Ambulatory Visit: Payer: Self-pay

## 2019-11-03 ENCOUNTER — Ambulatory Visit: Admitting: Nurse Practitioner

## 2019-11-03 VITALS — BP 118/82 | HR 81 | Temp 98.1°F | Resp 20 | Ht 72.0 in | Wt 232.0 lb

## 2019-11-03 DIAGNOSIS — Z7901 Long term (current) use of anticoagulants: Secondary | ICD-10-CM

## 2019-11-03 DIAGNOSIS — Z86711 Personal history of pulmonary embolism: Secondary | ICD-10-CM | POA: Diagnosis not present

## 2019-11-03 LAB — COAGUCHEK XS/INR WAIVED
INR: 2.5 — ABNORMAL HIGH (ref 0.9–1.1)
Prothrombin Time: 30.5 s

## 2019-11-03 NOTE — Progress Notes (Signed)
Subjective:   Chief Complaint: INR   Indication: PE Bleeding signs/symptoms: None Thromboembolic signs/symptoms: None  Missed Coumadin doses: None Medication changes: no Dietary changes: no Bacterial/viral infection: no Other concerns: no  The following portions of the patient's history were reviewed and updated as appropriate: allergies, current medications, past family history, past medical history, past social history, past surgical history and problem list.  Review of Systems Pertinent items noted in HPI and remainder of comprehensive ROS otherwise negative.   Objective:    INR Today: 2.5 Current dose: coumadin 10mg  daily except 15mg  on mon and fri.    Assessment:    Therapeutic INR for goal of 2-3   Plan:    1. New dose: no change   2. Next INR: 1 month    [name] , FNP

## 2019-11-21 ENCOUNTER — Ambulatory Visit: Admitting: Nurse Practitioner

## 2019-11-21 ENCOUNTER — Encounter: Payer: Self-pay | Admitting: Nurse Practitioner

## 2019-11-21 ENCOUNTER — Other Ambulatory Visit: Payer: Self-pay

## 2019-11-21 VITALS — BP 116/83 | HR 68 | Temp 98.1°F | Resp 20 | Ht 72.0 in | Wt 231.0 lb

## 2019-11-21 DIAGNOSIS — Z7901 Long term (current) use of anticoagulants: Secondary | ICD-10-CM | POA: Diagnosis not present

## 2019-11-21 DIAGNOSIS — Z86711 Personal history of pulmonary embolism: Secondary | ICD-10-CM

## 2019-11-21 LAB — COAGUCHEK XS/INR WAIVED
INR: 2.2 — ABNORMAL HIGH (ref 0.9–1.1)
Prothrombin Time: 26.5 s

## 2019-11-21 NOTE — Progress Notes (Signed)
Subjective:   chief complaint:   Indication: PE Bleeding signs/symptoms: None Thromboembolic signs/symptoms: None  Missed Coumadin doses: None Medication changes: no Dietary changes: no Bacterial/viral infection: no Other concerns: no  The following portions of the patient's history were reviewed and updated as appropriate: allergies, current medications, past family history, past medical history, past social history, past surgical history and problem list.  Review of Systems Pertinent items noted in HPI and remainder of comprehensive ROS otherwise negative.   Objective:    INR Today: 2.2 Current dose: coumadin 10mg  daily exceot 15mg  mon and fri    Assessment:    Therapeutic INR for goal of 2-3   Plan:    1. New dose: no change   2. Next INR: 1 month    [name] , FNP

## 2019-12-01 ENCOUNTER — Ambulatory Visit: Payer: Self-pay | Admitting: Nurse Practitioner

## 2020-01-01 ENCOUNTER — Ambulatory Visit: Admitting: Nurse Practitioner

## 2020-01-01 ENCOUNTER — Encounter: Payer: Self-pay | Admitting: Nurse Practitioner

## 2020-01-01 ENCOUNTER — Other Ambulatory Visit: Payer: Self-pay

## 2020-01-01 VITALS — BP 119/80 | HR 58 | Temp 98.2°F | Resp 20 | Ht 72.0 in | Wt 232.0 lb

## 2020-01-01 DIAGNOSIS — Z86711 Personal history of pulmonary embolism: Secondary | ICD-10-CM | POA: Diagnosis not present

## 2020-01-01 DIAGNOSIS — Z23 Encounter for immunization: Secondary | ICD-10-CM

## 2020-01-01 DIAGNOSIS — Z7901 Long term (current) use of anticoagulants: Secondary | ICD-10-CM | POA: Diagnosis not present

## 2020-01-01 LAB — COAGUCHEK XS/INR WAIVED
INR: 2.4 — ABNORMAL HIGH (ref 0.9–1.1)
Prothrombin Time: 29.3 s

## 2020-01-01 NOTE — Progress Notes (Signed)
Subjective:   Chief Complaint; INR recheck   Indication: PE Bleeding signs/symptoms: None Thromboembolic signs/symptoms: None  Missed Coumadin doses: None Medication changes: no Dietary changes: no Bacterial/viral infection: no Other concerns: no  The following portions of the patient's history were reviewed and updated as appropriate: allergies, current medications, past family history, past medical history, past social history, past surgical history and problem list.  Review of Systems Pertinent items noted in HPI and remainder of comprehensive ROS otherwise negative.   Objective:    INR Today: 2.4 Current dose:    Continue 10mg  daily except 15mg  Monday and Friday  Assessment:    Therapeutic INR for goal of 2-3   Plan:    1. New dose: no change   2. Next INR: 1 month    [name] Friday, FNP

## 2020-01-11 ENCOUNTER — Other Ambulatory Visit: Payer: Self-pay | Admitting: Nurse Practitioner

## 2020-01-11 DIAGNOSIS — Z86711 Personal history of pulmonary embolism: Secondary | ICD-10-CM

## 2020-01-30 ENCOUNTER — Ambulatory Visit: Admitting: Nurse Practitioner

## 2020-01-30 ENCOUNTER — Other Ambulatory Visit: Payer: Self-pay

## 2020-01-30 ENCOUNTER — Encounter: Payer: Self-pay | Admitting: Nurse Practitioner

## 2020-01-30 VITALS — BP 125/85 | HR 74 | Temp 98.1°F | Resp 20 | Ht 72.0 in | Wt 233.0 lb

## 2020-01-30 DIAGNOSIS — Z7901 Long term (current) use of anticoagulants: Secondary | ICD-10-CM | POA: Diagnosis not present

## 2020-01-30 DIAGNOSIS — Z86711 Personal history of pulmonary embolism: Secondary | ICD-10-CM

## 2020-01-30 LAB — COAGUCHEK XS/INR WAIVED
INR: 2.1 — ABNORMAL HIGH (ref 0.9–1.1)
Prothrombin Time: 24.7 s

## 2020-01-30 NOTE — Addendum Note (Signed)
Addended by: Cleda Daub on: 01/30/2020 08:51 AM   Modules accepted: Orders

## 2020-01-30 NOTE — Progress Notes (Signed)
Subjective:   chief complaint: INR recheck   Indication: PE Bleeding signs/symptoms: None Thromboembolic signs/symptoms: None  Missed Coumadin doses: None Medication changes: no Dietary changes: no Bacterial/viral infection: no Other concerns: no  The following portions of the patient's history were reviewed and updated as appropriate: allergies, current medications, past family history, past medical history, past social history, past surgical history and problem list.  Review of Systems Pertinent items noted in HPI and remainder of comprehensive ROS otherwise negative.   Objective:    INR Today: 2.1 Current dose:  Continue 10mg  daily except 15mg  Monday and Friday    Assessment:    Therapeutic INR for goal of 2-3   Plan:    1. New dose: no change   2. Next INR: 1 month    [name] Thursday, FNP

## 2020-02-26 ENCOUNTER — Ambulatory Visit (INDEPENDENT_AMBULATORY_CARE_PROVIDER_SITE_OTHER): Admitting: Nurse Practitioner

## 2020-02-26 ENCOUNTER — Other Ambulatory Visit: Payer: Self-pay

## 2020-02-26 ENCOUNTER — Other Ambulatory Visit

## 2020-02-26 ENCOUNTER — Encounter: Payer: Self-pay | Admitting: Nurse Practitioner

## 2020-02-26 VITALS — BP 126/81 | HR 69 | Temp 98.4°F | Resp 20 | Ht 72.0 in | Wt 232.0 lb

## 2020-02-26 DIAGNOSIS — Z7901 Long term (current) use of anticoagulants: Secondary | ICD-10-CM

## 2020-02-26 DIAGNOSIS — Z86711 Personal history of pulmonary embolism: Secondary | ICD-10-CM | POA: Diagnosis not present

## 2020-02-26 LAB — COAGUCHEK XS/INR WAIVED
INR: 2.7 — ABNORMAL HIGH (ref 0.9–1.1)
Prothrombin Time: 32.5 s

## 2020-02-26 NOTE — Addendum Note (Signed)
Addended by: Austin Miles F on: 02/26/2020 11:54 AM   Modules accepted: Orders

## 2020-02-26 NOTE — Progress Notes (Signed)
Subjective:   chief Complaint: INR recheck   Indication: PE Bleeding signs/symptoms: had a nose bleed on Saturday morning for about 10 minutes. He had just got back from [place] and thinks that may have had something to do with it. Thromboembolic signs/symptoms: None  Missed Coumadin doses: None Medication changes: no Dietary changes: no Bacterial/viral infection: no Other concerns: no  The following portions of the patient's history were reviewed and updated as appropriate: allergies, current medications, past family history, past medical history, past social history, past surgical history and problem list.  Review of Systems Pertinent items noted in HPI and remainder of comprehensive ROS otherwise negative.   Objective:    INR Today: 2.7 Current dose: Continue 10mg  daily except 15mg  Monday and Friday    Assessment:    Therapeutic INR for goal of 2-3   Plan:    1. New dose: no change   2. Next INR: 1 month     [name] Saturday, FNP

## 2020-02-27 ENCOUNTER — Ambulatory Visit: Payer: Self-pay | Admitting: Nurse Practitioner

## 2020-03-07 ENCOUNTER — Ambulatory Visit: Payer: Self-pay | Admitting: Nurse Practitioner

## 2020-03-26 ENCOUNTER — Ambulatory Visit: Payer: Self-pay | Admitting: Nurse Practitioner

## 2020-03-28 ENCOUNTER — Ambulatory Visit (INDEPENDENT_AMBULATORY_CARE_PROVIDER_SITE_OTHER): Admitting: Nurse Practitioner

## 2020-03-28 ENCOUNTER — Encounter: Payer: Self-pay | Admitting: Nurse Practitioner

## 2020-03-28 ENCOUNTER — Other Ambulatory Visit: Payer: Self-pay

## 2020-03-28 VITALS — BP 139/82 | HR 65 | Temp 98.3°F | Resp 20 | Ht 72.0 in | Wt 225.0 lb

## 2020-03-28 DIAGNOSIS — Z7901 Long term (current) use of anticoagulants: Secondary | ICD-10-CM

## 2020-03-28 DIAGNOSIS — Z86711 Personal history of pulmonary embolism: Secondary | ICD-10-CM

## 2020-03-28 LAB — COAGUCHEK XS/INR WAIVED
INR: 2 — ABNORMAL HIGH (ref 0.9–1.1)
Prothrombin Time: 23.8 s

## 2020-03-28 NOTE — Progress Notes (Signed)
Subjective:   Chief Complaint: INR recheck   Indication: atrial fibrillation Bleeding signs/symptoms: None Thromboembolic signs/symptoms: None  Missed Coumadin doses: None Medication changes: no Dietary changes: no Bacterial/viral infection: no Other concerns: no  The following portions of the patient's history were reviewed and updated as appropriate: allergies, current medications, past family history, past medical history, past social history, past surgical history and problem list.  Review of Systems Pertinent items noted in HPI and remainder of comprehensive ROS otherwise negative.   Objective:    INR Today: 2.0 Current dose: coumadin 10mg  daily except 15mg  on mon and friday    Assessment:    Therapeutic INR for goal of 2-3   Plan:    1. New dose: no change   2. Next INR: 1 month    [name] , FNP]]

## 2020-04-25 ENCOUNTER — Encounter: Payer: Self-pay | Admitting: Nurse Practitioner

## 2020-04-25 ENCOUNTER — Ambulatory Visit (INDEPENDENT_AMBULATORY_CARE_PROVIDER_SITE_OTHER): Payer: 59 | Admitting: Nurse Practitioner

## 2020-04-25 ENCOUNTER — Other Ambulatory Visit: Payer: Self-pay

## 2020-04-25 VITALS — BP 117/77 | HR 57 | Temp 98.1°F | Resp 20 | Ht 72.0 in | Wt 214.0 lb

## 2020-04-25 DIAGNOSIS — Z86711 Personal history of pulmonary embolism: Secondary | ICD-10-CM

## 2020-04-25 DIAGNOSIS — Z7901 Long term (current) use of anticoagulants: Secondary | ICD-10-CM

## 2020-04-25 LAB — COAGUCHEK XS/INR WAIVED
INR: 1.9 — ABNORMAL HIGH (ref 0.9–1.1)
Prothrombin Time: 23.3 s

## 2020-04-25 NOTE — Progress Notes (Signed)
Subjective:   chief complaint: INR recheck   Indication: PE Bleeding signs/symptoms: None Thromboembolic signs/symptoms: None  Missed Coumadin doses: None Medication changes: no Dietary changes: no Bacterial/viral infection: no Other concerns: no  The following portions of the patient's history were reviewed and updated as appropriate: allergies, current medications, past family history, past medical history, past social history, past surgical history and problem list.  Review of Systems Pertinent items noted in HPI and remainder of comprehensive ROS otherwise negative.   Objective:    INR Today: 1.9 Current dose: Coumadin 10mg  daily except 15mg  Monday and Friday    Body mass index is 29.02 kg/m.  Assessment:    Subtherapeutic INR for goal of 2-3   Plan:    1. New dose: no change   2. Next INR: 1 month    Allen Saturday, FNP

## 2020-05-23 ENCOUNTER — Other Ambulatory Visit: Payer: Self-pay

## 2020-05-23 ENCOUNTER — Ambulatory Visit (INDEPENDENT_AMBULATORY_CARE_PROVIDER_SITE_OTHER): Payer: 59 | Admitting: Nurse Practitioner

## 2020-05-23 ENCOUNTER — Encounter: Payer: Self-pay | Admitting: Nurse Practitioner

## 2020-05-23 VITALS — BP 116/76 | HR 66 | Temp 97.3°F | Resp 20 | Ht 72.0 in | Wt 206.0 lb

## 2020-05-23 DIAGNOSIS — Z86711 Personal history of pulmonary embolism: Secondary | ICD-10-CM

## 2020-05-23 DIAGNOSIS — Z7901 Long term (current) use of anticoagulants: Secondary | ICD-10-CM

## 2020-05-23 LAB — COAGUCHEK XS/INR WAIVED
INR: 1.7 — ABNORMAL HIGH (ref 0.9–1.1)
Prothrombin Time: 20.9 s

## 2020-05-23 NOTE — Progress Notes (Signed)
Subjective:   CHief Complaint: INR recheck   Indication: PE Bleeding signs/symptoms: None Thromboembolic signs/symptoms: None  Missed Coumadin doses: None Medication changes: no Dietary changes: no Bacterial/viral infection: no Other concerns: no  The following portions of the patient's history were reviewed and updated as appropriate: allergies, current medications, past family history, past medical history, past social history, past surgical history and problem list.  Review of Systems Pertinent items noted in HPI and remainder of comprehensive ROS otherwise negative.   Objective:    INR Today: 1.7 Current dose: Coumadin 10mg  daily except 15mg  Monday and friday    Assessment:    Therapeutic INR for goal of 2-3   Plan:    1. New dose: coumadin 15mg  today then back on coumadin 10mg  daily except 15mg  on moday, Wednesday  and friday   2. Next INR: 2 weeks    [name] , FNP

## 2020-06-05 ENCOUNTER — Other Ambulatory Visit: Payer: Self-pay

## 2020-06-05 ENCOUNTER — Other Ambulatory Visit: Payer: 59

## 2020-06-05 DIAGNOSIS — Z7901 Long term (current) use of anticoagulants: Secondary | ICD-10-CM

## 2020-06-05 LAB — COAGUCHEK XS/INR WAIVED
INR: 2.4 — ABNORMAL HIGH (ref 0.9–1.1)
Prothrombin Time: 28.3 s

## 2020-06-17 ENCOUNTER — Other Ambulatory Visit: Payer: Self-pay

## 2020-06-17 ENCOUNTER — Encounter: Payer: Self-pay | Admitting: Nurse Practitioner

## 2020-06-17 ENCOUNTER — Ambulatory Visit (INDEPENDENT_AMBULATORY_CARE_PROVIDER_SITE_OTHER): Payer: 59 | Admitting: Nurse Practitioner

## 2020-06-17 VITALS — BP 119/70 | HR 59 | Temp 98.3°F | Resp 20 | Ht 72.0 in | Wt 200.0 lb

## 2020-06-17 DIAGNOSIS — Z7901 Long term (current) use of anticoagulants: Secondary | ICD-10-CM | POA: Diagnosis not present

## 2020-06-17 DIAGNOSIS — Z86711 Personal history of pulmonary embolism: Secondary | ICD-10-CM

## 2020-06-17 LAB — COAGUCHEK XS/INR WAIVED
INR: 2.1 — ABNORMAL HIGH (ref 0.9–1.1)
Prothrombin Time: 23.2 s

## 2020-06-17 NOTE — Progress Notes (Signed)
Subjective:    Chief complaint: INR recheck   Indication: PE Bleeding signs/symptoms: None Thromboembolic signs/symptoms: None  Missed Coumadin doses: None Medication changes: no Dietary changes: no Bacterial/viral infection: no Other concerns: no  The following portions of the patient's history were reviewed and updated as appropriate: allergies, current medications, past family history, past medical history, past social history, past surgical history and problem list.  Review of Systems Pertinent items noted in HPI and remainder of comprehensive ROS otherwise negative.   Objective:    INR Today: 2.1 Current dose:  Continue 15mg  today then 1omg daily except 15mg   Monday,Wednesday and and Friday  Assessment:    Therapeutic INR for goal of 2-3   Plan:    1. New dose: no change   2. Next INR: 5 weeks   [name] , FNP

## 2020-07-15 ENCOUNTER — Ambulatory Visit (INDEPENDENT_AMBULATORY_CARE_PROVIDER_SITE_OTHER): Payer: 59 | Admitting: Nurse Practitioner

## 2020-07-15 ENCOUNTER — Other Ambulatory Visit: Payer: Self-pay

## 2020-07-15 ENCOUNTER — Encounter: Payer: Self-pay | Admitting: Nurse Practitioner

## 2020-07-15 VITALS — BP 105/65 | HR 58 | Temp 98.8°F | Ht 72.0 in | Wt 195.6 lb

## 2020-07-15 DIAGNOSIS — Z86711 Personal history of pulmonary embolism: Secondary | ICD-10-CM

## 2020-07-15 DIAGNOSIS — Z7901 Long term (current) use of anticoagulants: Secondary | ICD-10-CM | POA: Diagnosis not present

## 2020-07-15 LAB — COAGUCHEK XS/INR WAIVED
INR: 2 — ABNORMAL HIGH (ref 0.9–1.1)
Prothrombin Time: 23.6 s

## 2020-07-15 NOTE — Progress Notes (Signed)
Subjective:   Chief Complaint: INR recheck:    Indication: PE Bleeding signs/symptoms: None Thromboembolic signs/symptoms: None  Missed Coumadin doses: None Medication changes: no Dietary changes: no Bacterial/viral infection: no Other concerns: no  The following portions of the patient's history were reviewed and updated as appropriate: allergies, current medications, past family history, past medical history, past social history, past surgical history and problem list.  Review of Systems Pertinent items noted in HPI and remainder of comprehensive ROS otherwise negative.   Objective:    INR Today: 2.0 Current dose: coumadin 10mg  daily except 15mg  M,W,and F    Assessment:    Therapeutic INR for goal of 2-3   Plan:    1. New dose: no change   2. Next INR: 1 month    [name] , FNP

## 2020-07-25 ENCOUNTER — Ambulatory Visit (INDEPENDENT_AMBULATORY_CARE_PROVIDER_SITE_OTHER): Payer: 59 | Admitting: Neurology

## 2020-07-25 ENCOUNTER — Encounter: Payer: Self-pay | Admitting: *Deleted

## 2020-07-25 ENCOUNTER — Telehealth: Payer: Self-pay | Admitting: *Deleted

## 2020-07-25 ENCOUNTER — Encounter: Payer: Self-pay | Admitting: Neurology

## 2020-07-25 VITALS — BP 127/83 | HR 62 | Ht 72.0 in | Wt 196.0 lb

## 2020-07-25 DIAGNOSIS — G250 Essential tremor: Secondary | ICD-10-CM

## 2020-07-25 NOTE — Progress Notes (Signed)
PATIENT: Allen Watson DOB: [birth year]  Chief Complaint  Patient presents with  . Follow-up    Follow up for essential tremors. Denies any worsening of symptoms.      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  I have reviewed and summarized the referring note from the referring physician.  He is on chronic Coumadin treatment for history of PE happened in 2008, presented with chest pain then, likely PE in 2004.  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  ALLERGIES: No Known Allergies  HOME MEDICATIONS: Current Outpatient Medications  Medication Sig Dispense Refill  . calcium citrate-vitamin D (CITRACAL+D) 315-200 MG-UNIT per tablet Take 1 tablet by mouth 2 (two) times daily.    . Multiple Vitamin (MULTIVITAMIN WITH MINERALS) TABS tablet Take 1 tablet by mouth daily.    Marland Kitchen warfarin (COUMADIN) 5 MG tablet TAKE 2 TO 3 TABLETS DAILY AS  DIRECTED BY ANTICOAGULATION CLINIC 270 tablet 3   No current facility-administered medications for this visit.    PAST MEDICAL HISTORY: Past Medical History:  Diagnosis Date  . Pulmonary embolism (HCC)     PAST SURGICAL HISTORY: Past Surgical History:  Procedure Laterality Date  . SPLEENECTOMY  1990'S    FAMILY HISTORY: Family History  Problem Relation Age of Onset  . Bipolar disorder Mother   . Diabetes Father   . Lung cancer Paternal Uncle        x 2 uncles  . Colon cancer Neg Hx   . Stomach cancer Neg Hx   . Rectal cancer Neg Hx   . Esophageal cancer Neg Hx   . Liver cancer Neg Hx     SOCIAL HISTORY: Social History   Socioeconomic History  . Marital status: Married    Spouse name: Not on file  . Number of children: 2  . Years of education: Not on file  . Highest education level: Not on file  Occupational History  . Occupation: Recruitment consultant  Tobacco Use  . Smoking status: Never Smoker  . Smokeless tobacco: Never Used  Vaping Use  . Vaping Use: Never used  Substance and Sexual Activity  . Alcohol use: Yes    Comment: socially  . Drug use: No  . Sexual activity: Not on file  Other Topics Concern  . Not on file  Social History Narrative  . Not on file   Social Determinants of Health   Financial Resource Strain: Not on file  Food Insecurity: Not on file  Transportation Needs: Not on file  Physical Activity: Not on file  Stress: Not on file  Social Connections: Not on file  Intimate Partner Violence: Not on file     PHYSICAL EXAM   Vitals:   07/25/20 1525  BP: 127/83  Pulse: 62  Weight: 196 lb (88.9 kg)  Height: 6' (1.829 m)   Not recorded     Body mass index is 26.58 kg/m.  PHYSICAL EXAMNIATION:  Gen: NAD, conversant, well nourised, well groomed                    NEUROLOGICAL EXAM:  MENTAL STATUS: Speech/cognition: Awake, alert, oriented to history taking and casual conversation   CRANIAL NERVES: CN II: Visual fields  are full to confrontation. Pupils are round equal and briskly reactive to light. CN III, IV, VI: extraocular movement are normal. No ptosis. CN V: Facial sensation is intact to light touch CN VII: Face is symmetric with normal eye closure  CN VIII: Hearing is normal to causal conversation. CN IX, X: Phonation is normal. CN XI: Head turning and shoulder shrug are intact  MOTOR: He has mild bilateral hands posturing tremor, muscle bulk, tone and strength were normal  REFLEXES: Reflexes are 2+ and symmetric at the biceps, triceps, knees, and ankles. Plantar responses are flexor.  SENSORY: Intact to light touch, pinprick and vibratory sensation are intact in fingers and toes.  COORDINATION: There is no trunk or limb dysmetria noted.  GAIT/STANCE: Posture is normal. Gait is steady with normal steps, base, arm swing, and turning. Heel and toe walking are normal. Tandem gait is normal.  Romberg is absent.   DIAGNOSTIC DATA (LABS, IMAGING, TESTING) - I reviewed patient records, labs, notes, testing and imaging myself where available.   ASSESSMENT AND PLAN  Allen Watson is a 55 y.o. male   Familial essential tremor  That is confirmed by his longstanding clinical presentation, strong family history, mild bilateral postural tremor on examinations  Laboratory evaluation previously showed no treatable etiology, including normal TSH, CMP, CBC in May 2021  Letter to his employee was written,  Only return to clinic for new issues   Levert Feinstein, M.D. Ph.D.  [facility] [address], [address] [place], [place] [address] Ph: [phone] Fax: [phone]  CC: [name], FNP

## 2020-07-25 NOTE — Telephone Encounter (Signed)
Received mychart message from the patient with the requirements from his employer. This is the subject he would like to discuss at this appt this afternoon.  Liz Malady  [facility] The [employer] requires a "current neurological detailed clinical progress note regarding my history of essential tremor that addresses the etiology and focal deficits. The report should address history and symptoms, diagnosis, treatment plan, current over the counter and prescription medications (name, dosage, indication, frequency of use and side effects), and prognosis. Include the results of any current testing deemed appropriate."   The letter should be addressed to Dr. Marletta Lor (same doctor as the last one.   Fax [phone] or email [email or web address]

## 2020-07-25 NOTE — Telephone Encounter (Signed)
I spoke to the patient this morning.  Seen last year for essential tremors. He does not take any medication for this condition. He was provided a letter for his employer that worked well. However, this year they are requesting additional information. He works as a Passenger transport manager. He has a pending appt today at 4pm.  He would like to discuss the information needed prior to his follow up. He is going to call back with his specific request.

## 2020-08-13 ENCOUNTER — Ambulatory Visit: Payer: Self-pay | Admitting: Nurse Practitioner

## 2020-08-16 ENCOUNTER — Other Ambulatory Visit: Payer: Self-pay

## 2020-08-16 ENCOUNTER — Ambulatory Visit (INDEPENDENT_AMBULATORY_CARE_PROVIDER_SITE_OTHER): Payer: 59 | Admitting: Nurse Practitioner

## 2020-08-16 ENCOUNTER — Encounter: Payer: Self-pay | Admitting: Nurse Practitioner

## 2020-08-16 VITALS — BP 110/73 | HR 52 | Temp 98.6°F | Resp 20 | Ht 72.0 in | Wt 186.0 lb

## 2020-08-16 DIAGNOSIS — Z7901 Long term (current) use of anticoagulants: Secondary | ICD-10-CM

## 2020-08-16 DIAGNOSIS — Z86711 Personal history of pulmonary embolism: Secondary | ICD-10-CM

## 2020-08-16 LAB — COAGUCHEK XS/INR WAIVED
INR: 2.2 — ABNORMAL HIGH (ref 0.9–1.1)
Prothrombin Time: 25.8 s

## 2020-08-16 NOTE — Progress Notes (Signed)
Subjective:   Chief Complaint: INR recheck   Indication: PE Bleeding signs/symptoms: None Thromboembolic signs/symptoms: None  Missed Coumadin doses: None Medication changes: no Dietary changes: no Bacterial/viral infection: no Other concerns: no  The following portions of the patient's history were reviewed and updated as appropriate: allergies, current medications, past family history, past medical history, past social history, past surgical history, and problem list.  Review of Systems Pertinent items noted in HPI and remainder of comprehensive ROS otherwise negative.   Objective:    INR Today: 2.0 Current dose: Continue 15mg  today then 1omg daily except 15mg   Monday,Wednesday and and Friday   Assessment:    Therapeutic INR for goal of 2-3   Plan:    1. New dose: no change   2. Next INR: 1 month   [name] , FNP

## 2020-09-13 ENCOUNTER — Ambulatory Visit: Payer: Self-pay | Admitting: Nurse Practitioner

## 2020-09-23 ENCOUNTER — Other Ambulatory Visit: Payer: Self-pay

## 2020-09-23 ENCOUNTER — Encounter: Payer: Self-pay | Admitting: Nurse Practitioner

## 2020-09-23 ENCOUNTER — Ambulatory Visit (INDEPENDENT_AMBULATORY_CARE_PROVIDER_SITE_OTHER): Payer: 59 | Admitting: Nurse Practitioner

## 2020-09-23 VITALS — BP 110/69 | HR 51 | Temp 98.4°F | Resp 20 | Ht 72.0 in | Wt 184.0 lb

## 2020-09-23 DIAGNOSIS — Z86711 Personal history of pulmonary embolism: Secondary | ICD-10-CM

## 2020-09-23 DIAGNOSIS — Z7901 Long term (current) use of anticoagulants: Secondary | ICD-10-CM | POA: Diagnosis not present

## 2020-09-23 LAB — COAGUCHEK XS/INR WAIVED
INR: 1.9 — ABNORMAL HIGH (ref 0.9–1.1)
Prothrombin Time: 22.2 s

## 2020-09-23 NOTE — Progress Notes (Signed)
Subjective:  Chief Complaint: INR recheck    Indication: PE Bleeding signs/symptoms: None Thromboembolic signs/symptoms: None  Missed Coumadin doses: None Medication changes: no Dietary changes: no Bacterial/viral infection: no Other concerns: no  The following portions of the patient's history were reviewed and updated as appropriate: allergies, current medications, past family history, past medical history, past social history, past surgical history, and problem list.  Review of Systems Pertinent items noted in HPI and remainder of comprehensive ROS otherwise negative.   Objective:    INR Today: 1.9 Current dose: coumadin 10mg  daily except 15mg  m,W, and F   Assessment:    Therapeutic INR for goal of 2-3   Plan:    1. New dose: Take 3 tablets tomorrow ( 09/24/20 ) then Continue 15mg  today then 10 mg daily except 15mg   Monday,Wednesday and  Friday   2. Next INR: 1 month  [name] 09/26/20, FNP

## 2020-10-21 ENCOUNTER — Other Ambulatory Visit: Payer: Self-pay

## 2020-10-21 ENCOUNTER — Encounter: Payer: Self-pay | Admitting: Nurse Practitioner

## 2020-10-21 ENCOUNTER — Ambulatory Visit (INDEPENDENT_AMBULATORY_CARE_PROVIDER_SITE_OTHER): Payer: 59 | Admitting: Nurse Practitioner

## 2020-10-21 VITALS — BP 120/73 | HR 43 | Temp 97.9°F | Resp 20 | Ht 72.0 in | Wt 179.0 lb

## 2020-10-21 DIAGNOSIS — Z86711 Personal history of pulmonary embolism: Secondary | ICD-10-CM

## 2020-10-21 DIAGNOSIS — Z7901 Long term (current) use of anticoagulants: Secondary | ICD-10-CM

## 2020-10-21 LAB — COAGUCHEK XS/INR WAIVED
INR: 2.2 — ABNORMAL HIGH (ref 0.9–1.1)
Prothrombin Time: 26 s

## 2020-10-21 NOTE — Progress Notes (Signed)
Subjective:   Chief Complaint: INR recheck   Indication: PE Bleeding signs/symptoms: None Thromboembolic signs/symptoms: None  Missed Coumadin doses: None Medication changes: no Dietary changes: no Bacterial/viral infection: no Other concerns: no  The following portions of the patient's history were reviewed and updated as appropriate: allergies, current medications, past family history, past medical history, past social history, past surgical history, and problem list.  Review of Systems Pertinent items noted in HPI and remainder of comprehensive ROS otherwise negative.   Objective:    INR Today: 2.2 Current dose: coumadin 15mg  daily except 15mg  on Mon, wed and fri   Assessment:    Therapeutic INR for goal of 2-3   Plan:    1. New dose: no change   2. Next INR: 1 month  [name] , FNP

## 2020-11-14 ENCOUNTER — Ambulatory Visit (INDEPENDENT_AMBULATORY_CARE_PROVIDER_SITE_OTHER): Payer: 59 | Admitting: Nurse Practitioner

## 2020-11-14 ENCOUNTER — Encounter: Payer: Self-pay | Admitting: Nurse Practitioner

## 2020-11-14 ENCOUNTER — Other Ambulatory Visit: Payer: Self-pay

## 2020-11-14 VITALS — BP 112/74 | HR 47 | Temp 97.4°F | Resp 20 | Ht 72.0 in | Wt 180.0 lb

## 2020-11-14 DIAGNOSIS — Z86711 Personal history of pulmonary embolism: Secondary | ICD-10-CM

## 2020-11-14 DIAGNOSIS — Z7901 Long term (current) use of anticoagulants: Secondary | ICD-10-CM | POA: Diagnosis not present

## 2020-11-14 LAB — COAGUCHEK XS/INR WAIVED
INR: 2.1 — ABNORMAL HIGH (ref 0.9–1.1)
Prothrombin Time: 24.9 s

## 2020-11-14 NOTE — Progress Notes (Signed)
Subjective:   Chief Complaint: INR recheck   Indication: PE Bleeding signs/symptoms: None Thromboembolic signs/symptoms: None  Missed Coumadin doses: None Medication changes: no Dietary changes: no Bacterial/viral infection: no Other concerns: no  The following portions of the patient's history were reviewed and updated as appropriate: allergies, current medications, past family history, past medical history, past social history, past surgical history, and problem list.  Review of Systems Pertinent items noted in HPI and remainder of comprehensive ROS otherwise negative.   Objective:    INR Today: 2.1 Current dose:  Continue 15mg  today then 10 mg daily except 15mg   Monday,Wednesday and  Friday   Assessment:    Therapeutic INR for goal of 2-3   Plan:    1. New dose: no change   2. Next INR: 1 month  [name] , FNP

## 2020-11-18 ENCOUNTER — Ambulatory Visit: Payer: 59 | Admitting: Nurse Practitioner

## 2020-12-12 ENCOUNTER — Other Ambulatory Visit: Payer: Self-pay

## 2020-12-12 ENCOUNTER — Encounter: Payer: Self-pay | Admitting: Nurse Practitioner

## 2020-12-12 ENCOUNTER — Ambulatory Visit (INDEPENDENT_AMBULATORY_CARE_PROVIDER_SITE_OTHER): Payer: 59 | Admitting: Nurse Practitioner

## 2020-12-12 VITALS — BP 111/73 | HR 87 | Temp 97.9°F | Resp 20 | Ht 72.0 in | Wt 178.0 lb

## 2020-12-12 DIAGNOSIS — Z7901 Long term (current) use of anticoagulants: Secondary | ICD-10-CM

## 2020-12-12 DIAGNOSIS — Z86711 Personal history of pulmonary embolism: Secondary | ICD-10-CM | POA: Diagnosis not present

## 2020-12-12 LAB — COAGUCHEK XS/INR WAIVED
INR: 2.3 — ABNORMAL HIGH (ref 0.9–1.1)
Prothrombin Time: 27.2 s

## 2020-12-12 NOTE — Progress Notes (Signed)
Subjective:   Chief Complaint: INR recheck   Indication: PE Bleeding signs/symptoms: None Thromboembolic signs/symptoms: None  Missed Coumadin doses: None Medication changes: no Dietary changes: no Bacterial/viral infection: no Other concerns: no  The following portions of the patient's history were reviewed and updated as appropriate: allergies, current medications, past family history, past medical history, past social history, past surgical history, and problem list.  Review of Systems Pertinent items noted in HPI and remainder of comprehensive ROS otherwise negative.   Objective:    INR Today: 2.3 Current dose:  Continue 15mg  today then 10 mg daily except 15mg   Monday,Wednesday and  Friday   Assessment:    Therapeutic INR for goal of 2-3   Plan:    1. New dose: no change   2. Next INR: 1 month   [name] , FNP

## 2021-01-06 ENCOUNTER — Other Ambulatory Visit: Payer: Self-pay | Admitting: Nurse Practitioner

## 2021-01-06 DIAGNOSIS — Z86711 Personal history of pulmonary embolism: Secondary | ICD-10-CM

## 2021-01-09 ENCOUNTER — Ambulatory Visit (INDEPENDENT_AMBULATORY_CARE_PROVIDER_SITE_OTHER): Payer: 59 | Admitting: Nurse Practitioner

## 2021-01-09 ENCOUNTER — Other Ambulatory Visit: Payer: Self-pay

## 2021-01-09 ENCOUNTER — Encounter: Payer: Self-pay | Admitting: Nurse Practitioner

## 2021-01-09 ENCOUNTER — Ambulatory Visit (INDEPENDENT_AMBULATORY_CARE_PROVIDER_SITE_OTHER): Payer: 59

## 2021-01-09 VITALS — BP 104/61 | HR 49 | Temp 96.7°F | Ht 72.0 in | Wt 182.0 lb

## 2021-01-09 DIAGNOSIS — Z86711 Personal history of pulmonary embolism: Secondary | ICD-10-CM | POA: Diagnosis not present

## 2021-01-09 DIAGNOSIS — S8991XA Unspecified injury of right lower leg, initial encounter: Secondary | ICD-10-CM

## 2021-01-09 DIAGNOSIS — Z7901 Long term (current) use of anticoagulants: Secondary | ICD-10-CM | POA: Diagnosis not present

## 2021-01-09 DIAGNOSIS — Z23 Encounter for immunization: Secondary | ICD-10-CM | POA: Diagnosis not present

## 2021-01-09 LAB — COAGUCHEK XS/INR WAIVED
INR: 2.3 — ABNORMAL HIGH (ref 0.9–1.1)
Prothrombin Time: 27.8 s

## 2021-01-09 NOTE — Progress Notes (Signed)
Subjective:   Chief complaint: INR recheck   Indication: PE Bleeding signs/symptoms: None Thromboembolic signs/symptoms: None  Missed Coumadin doses: None Medication changes: no Dietary changes: no Bacterial/viral infection: no Other concerns: no  The following portions of the patient's history were reviewed and updated as appropriate: allergies, current medications, past family history, past medical history, past social history, past surgical history, and problem list. Larey Seat last week hiking and right lower ext is hurting. Able to walk but bearing weight hurts. Review of Systems Pertinent items noted in HPI and remainder of comprehensive ROS otherwise negative.   Objective:  No point tenderness of right lower leg- gait slow and steady   INR Today: 2.3 Current dose: coumadin 10mg  daily except 15mg   m,w and f  Assessment:    -Therapeutic INR for goal of 2-3  - right lower ext injury  Plan:    1. New dose: no change   2. Next INR: 1 month   Ice leg BID Rest Tylenol OTC as needed Follow up prn  [name] , FNP

## 2021-01-09 NOTE — Patient Instructions (Signed)
RICE Therapy for Routine Care of Injuries Many injuries can be cared for with rest, ice, compression, and elevation (RICE therapy). This includes: Resting the injured body part. Putting ice on the injury. Putting pressure (compression) on the injury. Raising the injured part (elevation). Using RICE therapy can help to lessen pain and swelling. Supplies needed: Ice. Plastic bag. Towel. Elastic bandage. Pillow or pillows to raise your injured body part. How to care for your injury with RICE therapy Rest Try to rest the injured part of your body. You can go back to your normal activities when your doctor says it is okay to do them and when you can do themwithout pain. If you rest the injury too much, it may not heal as well. Some injuries heal better with early movement instead of resting for too long. Ask your doctor ifyou should do exercises to help your injury get better. Ice  If told, put ice on the injured area. To do this: Put ice in a plastic bag. Place a towel between your skin and the bag. Leave the ice on for 20 minutes, 2-3 times a day. Take off the ice if your skin turns bright red. This is very important. If you cannot feel pain, heat, or cold, you have a greater risk of damage to the area. Do not put ice on your bare skin. Use ice for as many days as your doctor tells you to use it.  Compression Put pressure on the injured area. This can be done with an elastic bandage. If this type of bandage has been put on your injury: Follow instructions on the package the bandage came in about how to use it. Do not wrap the bandage too tightly. Wrap the bandage more loosely if part of your body beyond the bandage is blue, swollen, cold, painful, or loses feeling. Take off the bandage and put it on again every 3-4 hours or as told by your doctor. See your doctor if the bandage seems to make your problems worse.  Elevation Raise the injured area above the level of your heart while you  are sitting orlying down. Follow these instructions at home: If your symptoms get worse or last a long time, make a follow-up appointment with your doctor. You may need to have imaging tests, such as X-rays or an MRI. If you have imaging tests, ask how to get your results when they are ready. Return to your normal activities when your doctor says that it is safe. Keep all follow-up visits. Contact a doctor if: You keep having pain and swelling. Your symptoms get worse. Get help right away if: You have sudden, very bad pain at your injury or lower than your injury. You have redness or more swelling around your injury. You have tingling or numbness at your injury or lower than your injury, and it does not go away when you take off the bandage. Summary Many injuries can be cared for using rest, ice, compression, and elevation (RICE therapy). You can go back to your normal activities when your doctor says it is okay and when you can do them without pain. Put ice on the injured area as told by your doctor. Get help if your symptoms get worse or if you keep having pain and swelling. This information is not intended to replace advice given to you by your health care provider. Make sure you discuss any questions you have with your healthcare provider. Document Revised: 12/14/2019 Document Reviewed: 12/14/2019 Elsevier Patient Education    2022 [employer]  

## 2021-02-06 ENCOUNTER — Ambulatory Visit: Payer: 59 | Admitting: Nurse Practitioner

## 2021-02-10 ENCOUNTER — Encounter: Payer: Self-pay | Admitting: Nurse Practitioner

## 2021-02-10 ENCOUNTER — Ambulatory Visit (INDEPENDENT_AMBULATORY_CARE_PROVIDER_SITE_OTHER): Payer: 59 | Admitting: Nurse Practitioner

## 2021-02-10 VITALS — BP 114/73 | HR 60 | Temp 98.2°F | Resp 20 | Ht 72.0 in | Wt 187.0 lb

## 2021-02-10 DIAGNOSIS — Z86711 Personal history of pulmonary embolism: Secondary | ICD-10-CM

## 2021-02-10 DIAGNOSIS — Z7901 Long term (current) use of anticoagulants: Secondary | ICD-10-CM

## 2021-02-10 LAB — COAGUCHEK XS/INR WAIVED
INR: 3.5 — ABNORMAL HIGH (ref 0.9–1.1)
Prothrombin Time: 42.1 s

## 2021-02-10 NOTE — Progress Notes (Signed)
Subjective:   Chief complaint: INR recheck   Indication: PE Bleeding signs/symptoms: None Thromboembolic signs/symptoms: None  Missed Coumadin doses: None Medication changes: he accidentally took a double [employer] Saturday. Dietary changes: no Bacterial/viral infection: no Other concerns: no  The following portions of the patient's history were reviewed and updated as appropriate: allergies, current medications, past family history, past medical history, past social history, past surgical history, and problem list.  Review of Systems Pertinent items noted in HPI and remainder of comprehensive ROS otherwise negative.   Objective:    INR Today: 3.5 Current dose: coumadin 10mg  dail except 57m on Monday and friday   Assessment:    Subtherapeutic INR for goal of 2-3   Plan:    1. New dose: hold today ( 02/10/21 ) then back to 10mg  daily except 15mg  on mon and friday   2. Next INR: 1 month  Allen 14/5/22, FNP

## 2021-03-11 ENCOUNTER — Encounter: Payer: Self-pay | Admitting: Nurse Practitioner

## 2021-03-11 ENCOUNTER — Ambulatory Visit (INDEPENDENT_AMBULATORY_CARE_PROVIDER_SITE_OTHER): Payer: 59 | Admitting: Nurse Practitioner

## 2021-03-11 VITALS — BP 125/78 | HR 56 | Temp 98.3°F | Resp 20 | Ht 72.0 in | Wt 192.0 lb

## 2021-03-11 DIAGNOSIS — Z7901 Long term (current) use of anticoagulants: Secondary | ICD-10-CM

## 2021-03-11 DIAGNOSIS — Z86711 Personal history of pulmonary embolism: Secondary | ICD-10-CM

## 2021-03-11 LAB — COAGUCHEK XS/INR WAIVED
INR: 3.2 — ABNORMAL HIGH (ref 0.9–1.1)
Prothrombin Time: 38.3 s

## 2021-03-11 NOTE — Progress Notes (Signed)
Subjective:   Chief complaint: INR recheck  Indication: PE Bleeding signs/symptoms: None Thromboembolic signs/symptoms: None  Missed Coumadin doses: None Medication changes: no Dietary changes: no Bacterial/viral infection: no Other concerns: no  The following portions of the patient's history were reviewed and updated as appropriate: allergies, current medications, past family history, past medical history, past social history, past surgical history, and problem list.  Review of Systems Pertinent items noted in HPI and remainder of comprehensive ROS otherwise negative.   Objective:    INR Today: 3.2 Current dose: coumadin 18m daily except 15 mg on M and F   Assessment:    Supratherapeutic INR for goal of 2-3   Plan:    1. New dose: coumadin 10mg  daily except 15mg  on mondays   2. Next INR:  1 month  [name] , FNP

## 2021-04-07 ENCOUNTER — Encounter: Payer: Self-pay | Admitting: Nurse Practitioner

## 2021-04-07 ENCOUNTER — Ambulatory Visit (INDEPENDENT_AMBULATORY_CARE_PROVIDER_SITE_OTHER): Payer: 59 | Admitting: Nurse Practitioner

## 2021-04-07 VITALS — BP 116/74 | HR 50 | Temp 98.1°F | Resp 20 | Ht 72.0 in | Wt 188.0 lb

## 2021-04-07 DIAGNOSIS — Z86711 Personal history of pulmonary embolism: Secondary | ICD-10-CM | POA: Diagnosis not present

## 2021-04-07 DIAGNOSIS — Z7901 Long term (current) use of anticoagulants: Secondary | ICD-10-CM

## 2021-04-07 LAB — COAGUCHEK XS/INR WAIVED
INR: 1.6 — ABNORMAL HIGH (ref 0.9–1.1)
Prothrombin Time: 19.8 s

## 2021-04-07 NOTE — Progress Notes (Signed)
Subjective:   Chronic follow up: INR recheck:   Indication: PE Bleeding signs/symptoms: None Thromboembolic signs/symptoms: None  Missed Coumadin doses: None Medication changes: no Dietary changes: no Bacterial/viral infection: no Other concerns: no  The following portions of the patient's history were reviewed and updated as appropriate: allergies, current medications, past family history, past medical history, past social history, past surgical history, and problem list.  Review of Systems Pertinent items noted in HPI and remainder of comprehensive ROS otherwise negative.   Objective:    INR Today: 1.6 Current dose: coumadin 10mg  daily except 15mg  on MOn and friday   Assessment:    Subtherapeutic INR for goal of 2-3   Plan:    1. New dose: coumadin 10mg  daily except 15mg  on monday and friday   2. Next INR: 2 weeks   [name] , FNP

## 2021-04-08 ENCOUNTER — Ambulatory Visit: Payer: 59 | Admitting: Nurse Practitioner

## 2021-04-21 ENCOUNTER — Ambulatory Visit (INDEPENDENT_AMBULATORY_CARE_PROVIDER_SITE_OTHER): Payer: 59 | Admitting: Nurse Practitioner

## 2021-04-21 ENCOUNTER — Encounter: Payer: Self-pay | Admitting: Nurse Practitioner

## 2021-04-21 ENCOUNTER — Telehealth: Payer: Self-pay | Admitting: Neurology

## 2021-04-21 VITALS — BP 111/72 | HR 60 | Temp 98.0°F | Resp 20 | Ht 72.0 in | Wt 184.0 lb

## 2021-04-21 DIAGNOSIS — Z86711 Personal history of pulmonary embolism: Secondary | ICD-10-CM

## 2021-04-21 DIAGNOSIS — Z7901 Long term (current) use of anticoagulants: Secondary | ICD-10-CM

## 2021-04-21 LAB — COAGUCHEK XS/INR WAIVED
INR: 1.8 — ABNORMAL HIGH (ref 0.9–1.1)
Prothrombin Time: 21.6 s

## 2021-04-21 NOTE — Telephone Encounter (Signed)
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  Pt is asking if RN can call him with a scheduling opportunity ideally between 05-01-00-28 but no later than 05-20-21.  Pt is asking for a f/u/ evaluation with a letter (like last year) for the [facility]

## 2021-04-21 NOTE — Progress Notes (Signed)
Subjective:   Chief Complaint: INR receheck   Indication: PE Bleeding signs/symptoms: None Thromboembolic signs/symptoms: None  Missed Coumadin doses: None Medication changes: no Dietary changes: no Bacterial/viral infection: no Other concerns: no  The following portions of the patient's history were reviewed and updated as appropriate: allergies, current medications, past family history, past medical history, past social history, past surgical history, and problem list.  Review of Systems Pertinent items noted in HPI and remainder of comprehensive ROS otherwise negative.   Objective:    INR Today: 1.8 Current dose: coumadin 10mg  daily eccept 15mg  on M and friday   Assessment:    Subtherapeutic INR for goal of 2-3   Plan:    1. New dose: Coumadin 10mg  daily except 15mg  in M, W and F   2. Next INR: 2 weeks   Allen , FNP

## 2021-04-21 NOTE — Telephone Encounter (Signed)
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

## 2021-05-15 ENCOUNTER — Encounter: Payer: Self-pay | Admitting: Neurology

## 2021-05-15 ENCOUNTER — Encounter: Payer: Self-pay | Admitting: Nurse Practitioner

## 2021-05-15 ENCOUNTER — Ambulatory Visit (INDEPENDENT_AMBULATORY_CARE_PROVIDER_SITE_OTHER): Payer: 59 | Admitting: Neurology

## 2021-05-15 ENCOUNTER — Ambulatory Visit: Payer: 59 | Admitting: Nurse Practitioner

## 2021-05-15 ENCOUNTER — Ambulatory Visit (INDEPENDENT_AMBULATORY_CARE_PROVIDER_SITE_OTHER): Payer: 59 | Admitting: Nurse Practitioner

## 2021-05-15 VITALS — BP 121/78 | HR 58 | Temp 98.3°F | Resp 20 | Ht 72.0 in | Wt 192.0 lb

## 2021-05-15 VITALS — BP 132/85 | HR 61 | Ht 72.0 in | Wt 187.5 lb

## 2021-05-15 DIAGNOSIS — Z7901 Long term (current) use of anticoagulants: Secondary | ICD-10-CM

## 2021-05-15 DIAGNOSIS — G250 Essential tremor: Secondary | ICD-10-CM | POA: Diagnosis not present

## 2021-05-15 DIAGNOSIS — Z86711 Personal history of pulmonary embolism: Secondary | ICD-10-CM

## 2021-05-15 LAB — COAGUCHEK XS/INR WAIVED
INR: 1.8 — ABNORMAL HIGH (ref 0.9–1.1)
Prothrombin Time: 21.4 s

## 2021-05-15 NOTE — Progress Notes (Signed)
Subjective:  ?  Chief Complaint: INR recheck ? ? Indication: PE ?Bleeding signs/symptoms: None ?Thromboembolic signs/symptoms: None ? ?Missed Coumadin doses: None ?Medication changes: no ?Dietary changes: no ?Bacterial/viral infection: no ?Other concerns: no ? ?The following portions of the patient's history were reviewed and updated as appropriate: allergies, current medications, past family history, past medical history, past social history, past surgical history, and problem list. ? ?Review of Systems ?Pertinent items noted in HPI and remainder of comprehensive ROS otherwise negative.  ? ?Objective:  ? ? INR Today: 1.8 ?Current dose: coumadin 10mg  daily except 15mg  on m,w and f  ?Assessment:  ? ? Subtherapeutic INR for goal of 2-3  ? ?Plan:  ? ? 1. New dose: Coumadin 15mg  today then back to Coumadin 10mg  daily except 15mg  in M, W and F   ?2. Next INR: 2 weeks ? ?[name] , FNP ?  ?

## 2021-05-15 NOTE — Progress Notes (Signed)
? ? ?PATIENT: Allen Watson ?DOB: [birth year] ? ?Chief Complaint  ?Patient presents with  ? Follow-up  ?  Rm 13. Alone. ?Tremor - pt requested appt for work clearance letter. ?Pt remains stable.  ?  ? ?HISTORICAL ? 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 ? ?I have reviewed and summarized the referring note from the referring physician.  He is on chronic Coumadin treatment for history of PE happened in 2008, presented with chest pain then, likely PE in 2004.  Most recent INR was 1.9, ? ?He reported a history of bilateral tremor as long as he could remember, even dating back to his high school years, gradually become noticeable over the years, but no limitation in his daily function, he has bad handwriting, occasionally hand shaking would spill his cups, he denies loss sense of smell, no REM sleep disorder, no gait abnormality ? ?His father suffered similar tremor ? ?Laboratory evaluation in 2021, INR was 1.9, normal CMP, glucose of 122, lipid panel cholesterol 220, LDL 135 ? 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 ?UPDATE May 15 2021: ?He is doing well, he is a Aeronautical engineer, trains other pilots, he denies difficulty handling his job.  He continues to have bilateral hands tremor, not taking any medication for it. ? ?REVIEW OF SYSTEMS: Full 14 system review of systems performed and notable only for as above ?All other review of systems were negative. ? ?ALLERGIES: ?No Known Allergies ? ?HOME MEDICATIONS: ?Current Outpatient Medications  ?Medication Sig Dispense Refill  ? calcium citrate-vitamin D  (CITRACAL+D) 315-200 MG-UNIT per tablet Take 1 tablet by mouth 2 (two) times daily.    ? Multiple Vitamin (MULTIVITAMIN WITH MINERALS) TABS tablet Take 1 tablet by mouth daily.    ? warfarin (COUMADIN) 5 MG tablet TAKE 2 TO 3 TABLETS DAILY AS DIRECTED BY ANTICOAGULATION CLINIC 270 tablet 0  ? ?No current facility-administered medications for this visit.  ? ? ?PAST MEDICAL HISTORY: ?Past Medical History:  ?Diagnosis Date  ? Pulmonary embolism (HCC)   ? ? ?PAST SURGICAL HISTORY: ?Past Surgical History:  ?Procedure Laterality Date  ? SPLEENECTOMY  1990'S  ? ? ?FAMILY HISTORY: ?Family History  ?Problem Relation Age of Onset  ? Bipolar disorder Mother   ? Diabetes Father   ? Lung cancer Paternal Uncle   ?     x 2 uncles  ? Colon cancer Neg Hx   ? Stomach cancer Neg Hx   ? Rectal cancer Neg Hx   ? Esophageal cancer Neg Hx   ? Liver cancer Neg Hx   ? ? ?SOCIAL HISTORY: ?Social History  ? ?Socioeconomic History  ? Marital status: Married  ?  Spouse name: Not on file  ? Number of children: 2  ? Years of education: Not on file  ? Highest education level: Not on file  ?Occupational History  ? Occupation: Recruitment consultant  ?Tobacco Use  ? Smoking status: Never  ? Smokeless tobacco: Never  ?Vaping Use  ? Vaping Use: Never used  ?Substance and Sexual Activity  ? Alcohol use: Yes  ?  Comment: socially  ? Drug use:  No  ? Sexual activity: Not on file  ?Other Topics Concern  ? Not on file  ?Social History Narrative  ? Not on file  ? ?Social Determinants of Health  ? ?Financial Resource Strain: Not on file  ?Food Insecurity: Not on file  ?Transportation Needs: Not on file  ?Physical Activity: Not on file  ?Stress: Not on file  ?Social Connections: Not on file  ?Intimate Partner Violence: Not on file  ? ? ? ?PHYSICAL EXAM ?  ?Vitals:  ? 05/15/21 0853  ?BP: 132/85  ?Pulse: 61  ?Weight: 187 lb 8 oz (85 kg)  ?Height: 6' (1.829 m)  ? ?Not recorded ?  ? ? ?Body mass index is 25.43 kg/m?. ? ?PHYSICAL EXAMNIATION: ? ?Gen: NAD, conversant,  well nourised, well groomed                   ? ?NEUROLOGICAL EXAM: ? ?MENTAL STATUS: ?Speech/cognition: ?Awake, alert, oriented to history taking and casual conversation ?  ?CRANIAL NERVES: ?CN II: Visual fields are full to confrontation. Pupils are round equal and briskly reactive to light. ?CN III, IV, VI: extraocular movement are normal. No ptosis. ?CN V: Facial sensation is intact to light touch ?CN VII: Face is symmetric with normal eye closure  ?CN VIII: Hearing is normal to causal conversation. ?CN IX, X: Phonation is normal. ?CN XI: Head turning and shoulder shrug are intact ? ?MOTOR: ?He has  bilateral hands posturing tremor, normal muscle bulk, no rigidity no bradykinesia, and strength were normal ? ?REFLEXES: ?Reflexes are 2+ and symmetric at the biceps, triceps, knees, and ankles. Plantar responses are flexor. ? ?SENSORY: ?Intact to light touch, pinprick and vibratory sensation are intact in fingers and toes. ? ?COORDINATION: ?There is no trunk or limb dysmetria noted. ? ?GAIT/STANCE: ?Posture is normal. Gait is steady with normal steps, base, arm swing, and turning. Heel and toe walking are normal. Tandem gait is normal.  ?Romberg is absent. ? ? ?DIAGNOSTIC DATA (LABS, IMAGING, TESTING) ?- I reviewed patient records, labs, notes, testing and imaging myself where available. ? ? ?ASSESSMENT AND PLAN ? ?Zein Helbing is a 56 y.o. male   ?Familial essential tremor ? That is confirmed by his longstanding clinical presentation, strong family history, mild bilateral postural tremor on examinations ? Laboratory evaluation previously showed no treatable etiology, including normal TSH, CMP, CBC in May 2021 ? Letter to his employee was written, ? Only return to clinic for new issues ? ? ?Allen Watson, M.D. Ph.D. ? ?[facility] ?[address], [address] ?[place], [place] [address] ?Ph: [phone] ?Fax: [phone] ? ?CC: [name], FNP ?

## 2021-05-16 ENCOUNTER — Ambulatory Visit: Payer: 59 | Admitting: Nurse Practitioner

## 2021-05-27 ENCOUNTER — Ambulatory Visit: Payer: 59 | Admitting: Nurse Practitioner

## 2021-06-02 ENCOUNTER — Ambulatory Visit (INDEPENDENT_AMBULATORY_CARE_PROVIDER_SITE_OTHER): Payer: 59 | Admitting: Nurse Practitioner

## 2021-06-02 ENCOUNTER — Encounter: Payer: Self-pay | Admitting: Nurse Practitioner

## 2021-06-02 VITALS — BP 126/78 | HR 56 | Temp 97.8°F | Ht 72.0 in | Wt 189.6 lb

## 2021-06-02 DIAGNOSIS — Z86711 Personal history of pulmonary embolism: Secondary | ICD-10-CM | POA: Diagnosis not present

## 2021-06-02 DIAGNOSIS — Z7901 Long term (current) use of anticoagulants: Secondary | ICD-10-CM

## 2021-06-02 LAB — COAGUCHEK XS/INR WAIVED
INR: 2.1 — ABNORMAL HIGH (ref 0.9–1.1)
Prothrombin Time: 24.8 s

## 2021-06-02 NOTE — Progress Notes (Signed)
Subjective:  ? Chief complait: INR recheck ? ? Indication: PE ?Bleeding signs/symptoms: None ?Thromboembolic signs/symptoms: None ? ?Missed Coumadin doses: None ?Medication changes: no ?Dietary changes: no ?Bacterial/viral infection: no ?Other concerns: no ? ?The following portions of the patient's history were reviewed and updated as appropriate: allergies, current medications, past family history, past medical history, past social history, past surgical history, and problem list. ? ?Review of Systems ?Pertinent items noted in HPI and remainder of comprehensive ROS otherwise negative.  ? ?Objective:  ? ? INR Today: 2.1 ?Current dose: Coumadin 15mg  today then back to Coumadin 10mg  daily except 15mg  in M, W and F  ? ?Assessment:  ? ? Therapeutic INR for goal of 2-3  ? ?Plan:  ? ? 1. New dose: no change   ?2. Next INR: 1 month  ? ?Patient would like to switch to xeralto or eliquis- need to make suar that PE was not from valvular issue. Will order echo cardiogram. ?[name] M, FNP ? ?

## 2021-06-30 ENCOUNTER — Encounter: Payer: Self-pay | Admitting: Nurse Practitioner

## 2021-06-30 ENCOUNTER — Ambulatory Visit (INDEPENDENT_AMBULATORY_CARE_PROVIDER_SITE_OTHER): Payer: 59 | Admitting: Nurse Practitioner

## 2021-06-30 VITALS — BP 118/75 | HR 49 | Temp 97.8°F | Resp 20 | Ht 72.0 in | Wt 187.0 lb

## 2021-06-30 DIAGNOSIS — Z86711 Personal history of pulmonary embolism: Secondary | ICD-10-CM | POA: Diagnosis not present

## 2021-06-30 DIAGNOSIS — Z7901 Long term (current) use of anticoagulants: Secondary | ICD-10-CM | POA: Diagnosis not present

## 2021-06-30 LAB — COAGUCHEK XS/INR WAIVED
INR: 2.3 — ABNORMAL HIGH (ref 0.9–1.1)
Prothrombin Time: 28.1 s

## 2021-06-30 NOTE — Progress Notes (Signed)
Subjective:  ? Chief Complaint: INR rechcek ? ? Indication: PE ?Bleeding signs/symptoms: None ?Thromboembolic signs/symptoms: None ? ?Missed Coumadin doses: None ?Medication changes: no ?Dietary changes: no ?Bacterial/viral infection: no ?Other concerns: no ? ?The following portions of the patient's history were reviewed and updated as appropriate: allergies, current medications, past family history, past medical history, past social history, past surgical history, and problem list. ? ?Review of Systems ? ?Pertinent items noted in HPI and remainder of comprehensive ROS otherwise negative.  ? ?Objective:  ? ? INR Today: 2.3 ?Current dose: coumadin 10mg  daily except 15g on M,W and F  ? ?Assessment:  ? ? Therapeutic INR for goal of 2-3  ? ?Plan:  ? ? 1. New dose: no change   ?2. Next INR: 1 month  ? ?[name] Hassell Done, FNP ? ?

## 2021-07-10 ENCOUNTER — Ambulatory Visit (HOSPITAL_COMMUNITY)
Admission: RE | Admit: 2021-07-10 | Discharge: 2021-07-10 | Disposition: A | Discharge status: Home / Self Care | Payer: 59 | Source: Ambulatory Visit | Attending: Nurse Practitioner | Admitting: Nurse Practitioner

## 2021-07-10 DIAGNOSIS — I361 Nonrheumatic tricuspid (valve) insufficiency: Secondary | ICD-10-CM

## 2021-07-10 DIAGNOSIS — Z86711 Personal history of pulmonary embolism: Secondary | ICD-10-CM | POA: Insufficient documentation

## 2021-07-10 LAB — ECHOCARDIOGRAM COMPLETE
AR max vel: 2.58 cm2
AV Area VTI: 2.3 cm2
AV Area mean vel: 2.36 cm2
AV Mean grad: 5 mmHg
AV Peak grad: 12 mmHg
Ao pk vel: 1.73 m/s
Area-P 1/2: 3.4 cm2
Calc EF: 53.5 %
MV VTI: 2.29 cm2
S' Lateral: 3.6 cm
Single Plane A2C EF: 49.5 %
Single Plane A4C EF: 55.4 %

## 2021-07-10 NOTE — Progress Notes (Signed)
*  PRELIMINARY RESULTS* ?Echocardiogram ?2D Echocardiogram has been performed. ? ?Carolyne Fiscal ?07/10/2021, 9:12 AM ?

## 2021-07-14 ENCOUNTER — Ambulatory Visit (INDEPENDENT_AMBULATORY_CARE_PROVIDER_SITE_OTHER): Payer: 59 | Admitting: Nurse Practitioner

## 2021-07-14 ENCOUNTER — Encounter: Payer: Self-pay | Admitting: Nurse Practitioner

## 2021-07-14 VITALS — BP 114/72 | HR 52 | Temp 97.8°F | Resp 20 | Ht 72.0 in | Wt 187.0 lb

## 2021-07-14 DIAGNOSIS — Z86711 Personal history of pulmonary embolism: Secondary | ICD-10-CM | POA: Diagnosis not present

## 2021-07-14 DIAGNOSIS — Z7901 Long term (current) use of anticoagulants: Secondary | ICD-10-CM | POA: Diagnosis not present

## 2021-07-14 LAB — COAGUCHEK XS/INR WAIVED
INR: 2.1 — ABNORMAL HIGH (ref 0.9–1.1)
Prothrombin Time: 25.3 s

## 2021-07-14 MED ORDER — APIXABAN 2.5 MG PO TABS: 2.5000 mg | ORAL_TABLET | Freq: Two times a day (BID) | ORAL | 1 refills | Status: DC

## 2021-07-14 NOTE — Progress Notes (Signed)
Subjective:  ? Chief Complaint: INR recheck ? ? Indication: PE ?Bleeding signs/symptoms: None ?Thromboembolic signs/symptoms: None ? ?Missed Coumadin doses: None ?Medication changes: no ?Dietary changes: no ?Bacterial/viral infection: no ?Other concerns: no ? ?The following portions of the patient's history were reviewed and updated as appropriate: allergies, current medications, past family history, past medical history, past social history, past surgical history, and problem list. ? ?Review of Systems ?Pertinent items noted in HPI and remainder of comprehensive ROS otherwise negative.  ? ?Objective:  ? ? INR Today: 2.1 ?Current dose: coumadin 10mg  daily except 15mg  on M,W,F  ? ?Assessment:  ? ? Therapeutic INR for goal of 2-3  ? ?Plan:  ? ? 1. New dose: no change   ?2. Next INR: 1 month ?We will be switching over to eliquis when he comes back from his next  trip. ? ?[name] , FNP ? ?

## 2021-08-08 ENCOUNTER — Ambulatory Visit (INDEPENDENT_AMBULATORY_CARE_PROVIDER_SITE_OTHER): Payer: 59 | Admitting: Nurse Practitioner

## 2021-08-08 ENCOUNTER — Encounter: Payer: Self-pay | Admitting: Nurse Practitioner

## 2021-08-08 VITALS — BP 112/72 | HR 52 | Temp 98.1°F | Resp 20 | Ht 72.0 in | Wt 186.0 lb

## 2021-08-08 DIAGNOSIS — Z7901 Long term (current) use of anticoagulants: Secondary | ICD-10-CM

## 2021-08-08 DIAGNOSIS — Z86711 Personal history of pulmonary embolism: Secondary | ICD-10-CM | POA: Diagnosis not present

## 2021-08-08 LAB — COAGUCHEK XS/INR WAIVED
INR: 1.2 — ABNORMAL HIGH (ref 0.9–1.1)
Prothrombin Time: 14.2 s

## 2021-08-08 NOTE — Patient Instructions (Signed)
Apixaban Tablets What is this medication? APIXABAN (a PIX a ban) prevents or treats blood clots. It is also used to lower the risk of stroke in people with AFib (atrial fibrillation). It belongs to a group of medications called blood thinners. This medicine may be used for other purposes; ask your health care provider or pharmacist if you have questions. COMMON BRAND NAME(S): Eliquis What should I tell my care team before I take this medication? They need to know if you have any of these conditions: Antiphospholipid antibody syndrome Bleeding disorder History of bleeding in the brain History of blood clots History of stomach bleeding Kidney disease Liver disease Mechanical heart valve Spinal surgery An unusual or allergic reaction to apixaban, other medications, foods, dyes, or preservatives Pregnant or trying to get pregnant Breast-feeding How should I use this medication? Take this medication by mouth. For your therapy to work as well as possible, take each dose exactly as prescribed on the prescription label. Do not skip doses. Skipping doses or stopping this medication can increase your risk of a blood clot or stroke. Keep taking this medication unless your care team tells you to stop. Take it as directed on the prescription label at the same time every day. You can take it with or without food. If it upsets your stomach, take it with food. A special MedGuide will be given to you by the pharmacist with each prescription and refill. Be sure to read this information carefully each time. Talk to your care team about the use of this medication in children. Special care may be needed. Overdosage: If you think you have taken too much of this medicine contact a poison control center or emergency room at once. NOTE: This medicine is only for you. Do not share this medicine with others. What if I miss a dose? If you miss a dose, take it as soon as you can. If it is almost time for your next  dose, take only that dose. Do not take double or extra doses. What may interact with this medication? This medication may interact with the following: Aspirin and aspirin-like medications Certain medications for fungal infections like itraconazole and ketoconazole Certain medications for seizures like carbamazepine and phenytoin Certain medications for blood clots like enoxaparin, dalteparin, heparin, and warfarin Clarithromycin NSAIDs, medications for pain and inflammation, like ibuprofen or naproxen Rifampin Ritonavir St. John's wort This list may not describe all possible interactions. Give your health care provider a list of all the medicines, herbs, non-prescription drugs, or dietary supplements you use. Also tell them if you smoke, drink alcohol, or use illegal drugs. Some items may interact with your medicine. What should I watch for while using this medication? Visit your healthcare professional for regular checks on your progress. You may need blood work done while you are taking this medication. Your condition will be monitored carefully while you are receiving this medication. It is important not to miss any appointments. Avoid sports and activities that might cause injury while you are using this medication. Severe falls or injuries can cause unseen bleeding. Be careful when using sharp tools or knives. Consider using an Neurosurgeon. Take special care brushing or flossing your teeth. Report any injuries, bruising, or red spots on the skin to your healthcare professional. If you are going to need surgery or other procedure, tell your healthcare professional that you are taking this medication. Wear a medical ID bracelet or chain. Carry a card that describes your disease and details of your  medication and dosage times. What side effects may I notice from receiving this medication? Side effects that you should report to your care team as soon as possible: Allergic reactions--skin  rash, itching, hives, swelling of the face, lips, tongue, or throat Bleeding--bloody or black, tar-like stools, vomiting blood or brown material that looks like coffee grounds, red or dark brown urine, small red or purple spots on the skin, unusual bruising or bleeding Bleeding in the brain--severe headache, stiff neck, confusion, dizziness, change in vision, numbness or weakness of the face, arm, or leg, trouble speaking, trouble walking, vomiting Heavy periods This list may not describe all possible side effects. Call your doctor for medical advice about side effects. You may report side effects to FDA at 1-800-FDA-1088. Where should I keep my medication? Keep out of the reach of children and pets. Store at room temperature between 20 and 25 degrees C (68 and 77 degrees F). Get rid of any unused medication after the expiration date. To get rid of medications that are no longer needed or expired: Take the medication to a medication take-back program. Check with your pharmacy or law enforcement to find a location. If you cannot return the medication, check the label or package insert to see if the medication should be thrown out in the garbage or flushed down the toilet. If you are not sure, ask your care team. If it is safe to put in the trash, empty the medication out of the container. Mix the medication with cat litter, dirt, coffee grounds, or other unwanted substance. Seal the mixture in a bag or container. Put it in the trash. NOTE: This sheet is a summary. It may not cover all possible information. If you have questions about this medicine, talk to your doctor, pharmacist, or health care provider.  2023 Elsevier/Gold Standard (2020-03-22 00:00:00)

## 2021-08-08 NOTE — Progress Notes (Signed)
Subjective:   Chief complaint: INR recheck   Indication: PE Bleeding signs/symptoms: None Thromboembolic signs/symptoms: None  Missed Coumadin doses: has not taken coumadin the last 3 days. Medication changes: no Dietary changes: no Bacterial/viral infection: no Other concerns: no  The following portions of the patient's history were reviewed and updated as appropriate: allergies, current medications, past family history, past medical history, past social history, past surgical history, and problem list.  Review of Systems Pertinent items noted in HPI and remainder of comprehensive ROS otherwise negative.   Objective:    INR Today: 1.2 Current dose: stopped taking 3 days ago   Assessment:    Subtherapeutic INR for goal of 2.5-3.5   Plan:    1. New dose: he is stopping coumadin and will sytart eliqui stoday BID   2. Next INR: no longer needed.  [name] [name], FNP

## 2021-12-13 ENCOUNTER — Other Ambulatory Visit: Payer: Self-pay | Admitting: Nurse Practitioner

## 2022-01-23 ENCOUNTER — Ambulatory Visit (INDEPENDENT_AMBULATORY_CARE_PROVIDER_SITE_OTHER): Payer: 59

## 2022-01-23 ENCOUNTER — Encounter: Payer: Self-pay | Admitting: Nurse Practitioner

## 2022-01-23 ENCOUNTER — Ambulatory Visit (INDEPENDENT_AMBULATORY_CARE_PROVIDER_SITE_OTHER): Payer: 59 | Admitting: Nurse Practitioner

## 2022-01-23 VITALS — BP 120/66 | HR 55 | Temp 98.5°F | Ht 72.0 in | Wt 199.4 lb

## 2022-01-23 DIAGNOSIS — R1031 Right lower quadrant pain: Secondary | ICD-10-CM

## 2022-01-23 LAB — URINALYSIS, ROUTINE W REFLEX MICROSCOPIC
Bilirubin, UA: NEGATIVE
Glucose, UA: NEGATIVE
Ketones, UA: NEGATIVE
Leukocytes,UA: NEGATIVE
Nitrite, UA: NEGATIVE
Protein,UA: NEGATIVE
RBC, UA: NEGATIVE
Specific Gravity, UA: <1.005 — ABNORMAL LOW (ref 1.005–1.030)
Urobilinogen, Ur: 0.2 mg/dL (ref 0.2–1.0)
pH, UA: 5.5 (ref 5.0–7.5)

## 2022-01-23 NOTE — Progress Notes (Signed)
Acute Office Visit  Subjective:     Patient ID: Allen Watson, male    DOB: 02-05-1966, 56 y.o.   MRN: [id number]  Chief Complaint  Patient presents with   Abdominal Pain    Right sided middle going around to flank area started sometime this weekend, hurting when moving and when coughing at times    Abdominal Pain This is a new problem. The current episode started in the past 7 days. The onset quality is sudden. The pain is located in the RLQ and RUQ. The pain is at a severity of 5/10. The pain is moderate. The quality of the pain is aching. The abdominal pain radiates to the right flank. Pertinent negatives include no constipation, frequency, nausea or vomiting. The pain is aggravated by coughing and movement. The pain is relieved by Nothing.     Review of Systems  Constitutional: Negative.   HENT: Negative.    Respiratory: Negative.    Cardiovascular: Negative.   Gastrointestinal:  Positive for abdominal pain. Negative for constipation, nausea and vomiting.  Genitourinary:  Positive for flank pain. Negative for frequency.  Skin: Negative.  Negative for itching and rash.  Neurological: Negative.   All other systems reviewed and are negative.       Objective:    BP 120/66   Pulse (!) 55   Temp 98.5 F (36.9 C) (Temporal)   Ht 6' (1.829 m)   Wt 199 lb 6.4 oz (90.4 kg)   SpO2 99%   BMI 27.04 kg/m  BP Readings from Last 3 Encounters:  01/23/22 120/66  08/08/21 112/72  07/14/21 114/72   Wt Readings from Last 3 Encounters:  01/23/22 199 lb 6.4 oz (90.4 kg)  08/08/21 186 lb (84.4 kg)  07/14/21 187 lb (84.8 kg)      Physical Exam Vitals and nursing note reviewed.  Constitutional:      Appearance: He is well-developed.  HENT:     Head: Normocephalic.     Right Ear: External ear normal.     Left Ear: External ear normal.     Nose: Nose normal.  Eyes:     Conjunctiva/sclera: Conjunctivae normal.  Cardiovascular:     Rate and Rhythm: Normal rate.      Pulses: Normal pulses.     Heart sounds: Normal heart sounds.  Pulmonary:     Effort: Pulmonary effort is normal.     Breath sounds: Normal breath sounds.  Abdominal:     General: Bowel sounds are normal.     Tenderness: There is abdominal tenderness.  Skin:    General: Skin is warm.  Neurological:     General: No focal deficit present.     Mental Status: He is alert and oriented to person, place, and time.  Psychiatric:        Behavior: Behavior normal.     No results found for any visits on 01/23/22.      Assessment & Plan:  Symptoms of right flank and RUQ abdominal pain not well controlled. Patient denies fever, Nausea, vomiting and dysuria. Labs completed results pending. Increase hydration, tylenol for pain as needed. Follow up with unresolved symptoms.  Problem List Items Addressed This Visit   None Visit Diagnoses     Right lower quadrant abdominal pain    -  Primary   Relevant Orders   Urinalysis, Routine w reflex microscopic   CULTURE, URINE COMPREHENSIVE   DG Abd 1 View       No orders of the  defined types were placed in this encounter.   Return if symptoms worsen or fail to improve.  Ivy Lynn, NP

## 2022-01-23 NOTE — Patient Instructions (Signed)
Abdominal Pain, Adult Many things can cause belly (abdominal) pain. Most times, belly pain is not dangerous. Many cases of belly pain can be watched and treated at home. Sometimes, though, belly pain is serious. Your doctor will try to find the cause of your belly pain. Follow these instructions at home:  Medicines Take over-the-counter and prescription medicines only as told by your doctor. Do not take medicines that help you poop (laxatives) unless told by your doctor. General instructions Watch your belly pain for any changes. Drink enough fluid to keep your pee (urine) pale yellow. Keep all follow-up visits as told by your doctor. This is important. Contact a doctor if: Your belly pain changes or gets worse. You are not hungry, or you lose weight without trying. You are having trouble pooping (constipated) or have watery poop (diarrhea) for more than 2-3 days. You have pain when you pee or poop. Your belly pain wakes you up at night. Your pain gets worse with meals, after eating, or with certain foods. You are vomiting and cannot keep anything down. You have a fever. You have blood in your pee. Get help right away if: Your pain does not go away as soon as your doctor says it should. You cannot stop vomiting. Your pain is only in areas of your belly, such as the right side or the left lower part of the belly. You have bloody or black poop, or poop that looks like tar. You have very bad pain, cramping, or bloating in your belly. You have signs of not having enough fluid or water in your body (dehydration), such as: Dark pee, very little pee, or no pee. Cracked lips. Dry mouth. Sunken eyes. Sleepiness. Weakness. You have trouble breathing or chest pain. Summary Many cases of belly pain can be watched and treated at home. Watch your belly pain for any changes. Take over-the-counter and prescription medicines only as told by your doctor. Contact a doctor if your belly pain  changes or gets worse. Get help right away if you have very bad pain, cramping, or bloating in your belly. This information is not intended to replace advice given to you by your health care provider. Make sure you discuss any questions you have with your health care provider. Document Revised: 07/04/2018 Document Reviewed: 07/04/2018 Elsevier Patient Education  2023 Elsevier Inc.  

## 2022-01-27 LAB — CULTURE, URINE COMPREHENSIVE

## 2022-02-11 IMAGING — DX DG TIBIA/FIBULA 2V*R*
4 series · 4 of 4 positions shown · non-contrast
Comparison: None.

CLINICAL DATA: Right lower extremity injury.

EXAM:
RIGHT TIBIA AND FIBULA - 2 VIEW

[tibia ap (1 of 2)]
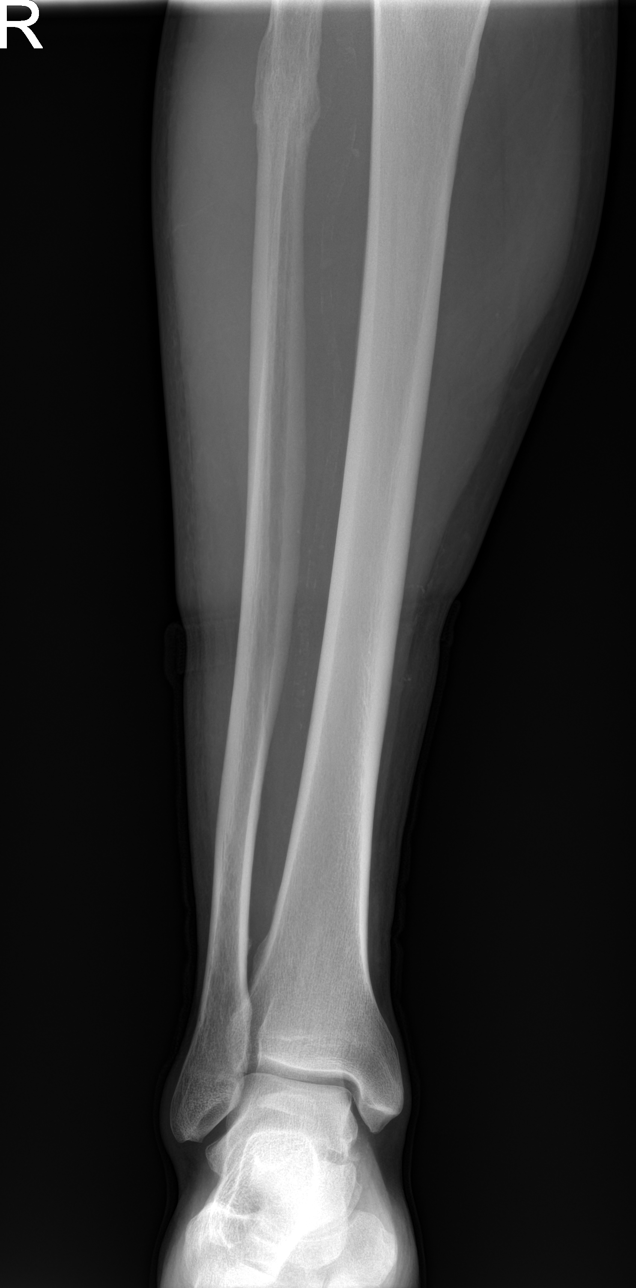

[tibia ap (2 of 2)]
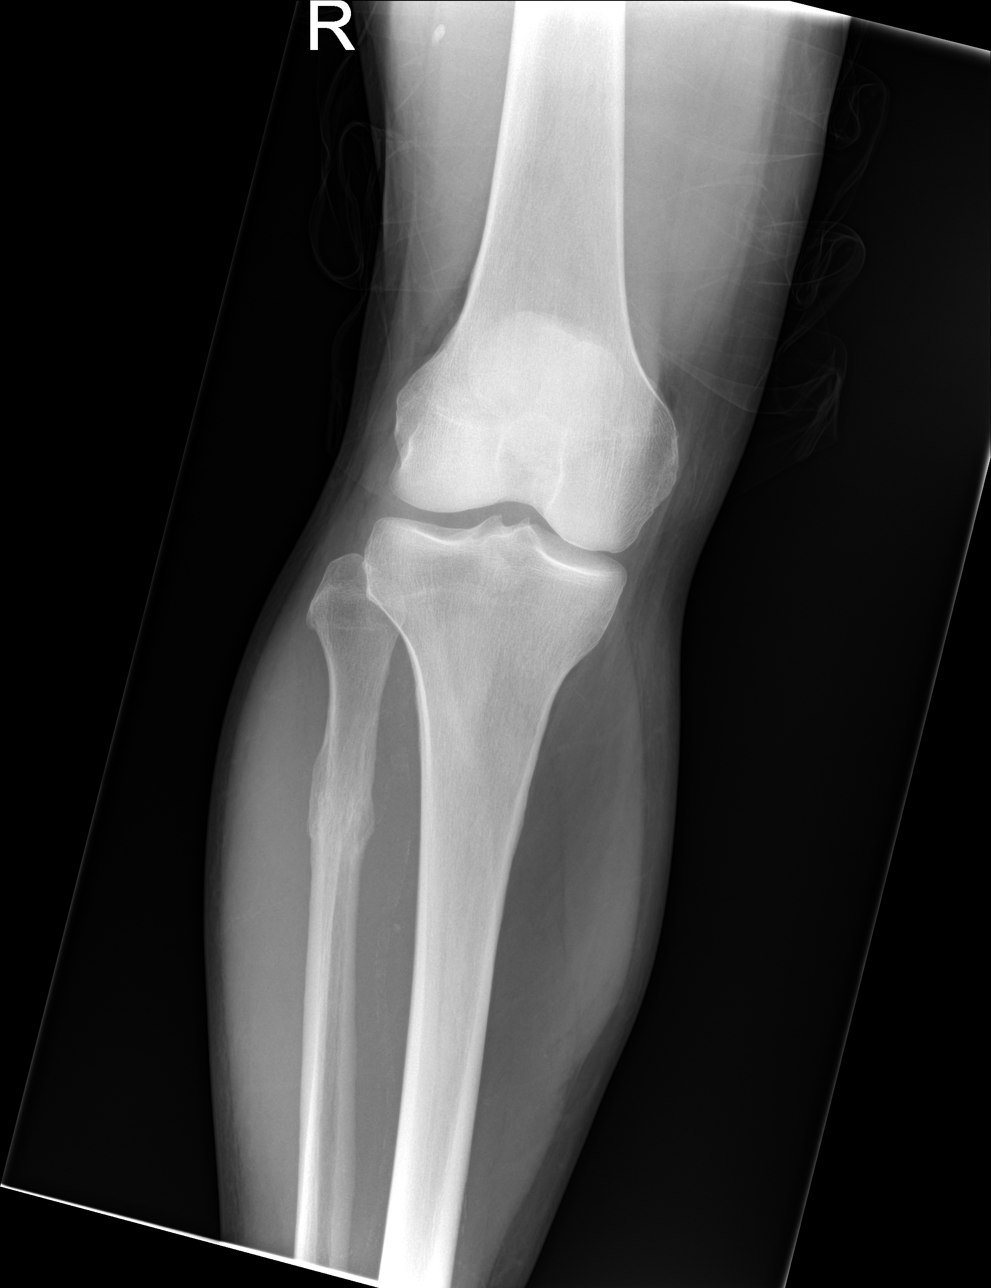

[tibia lat (1 of 2)]
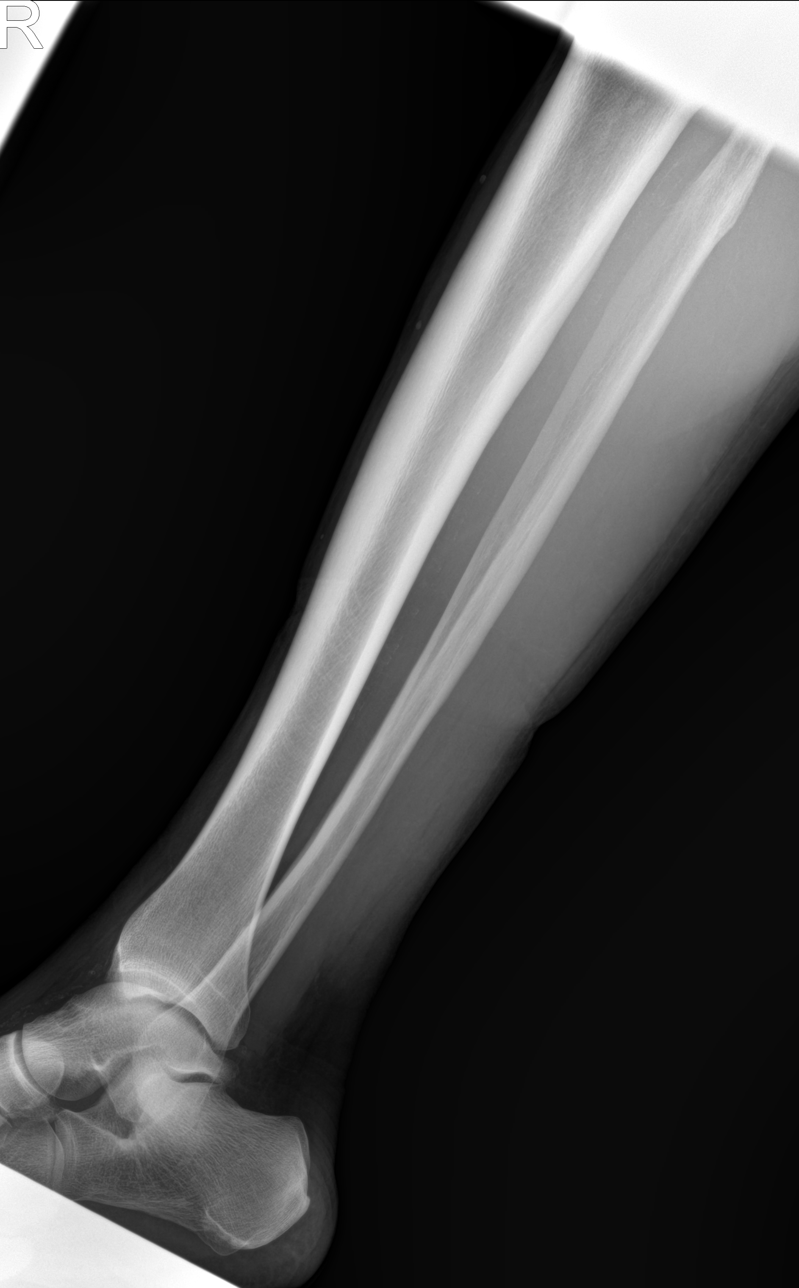

[tibia lat (2 of 2)]
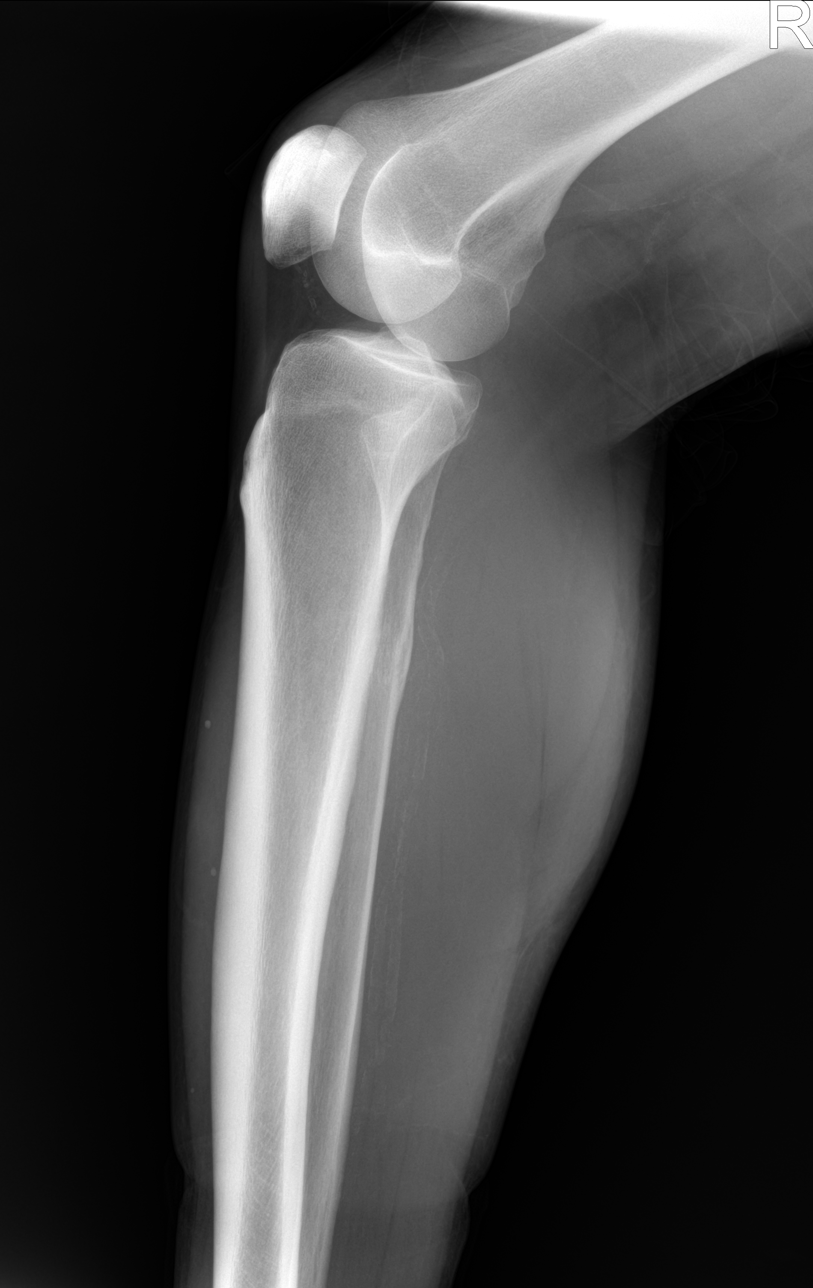

[4 of 4 positions shown; findings below may reference images not displayed]

FINDINGS: There is normal bone mineralization without evidence of acute
fracture with chronic healed fracture deformity of the proximal
fibular shaft. There is no visible focal soft tissue swelling. There
are scattered phleboliths in the pretibial soft tissues and there
are calcifications in the popliteal trifurcation arteries.
IMPRESSION: 1. No evidence of fractures.
2. Calcifications in the popliteal trifurcation arteries.

## 2022-02-19 ENCOUNTER — Ambulatory Visit (INDEPENDENT_AMBULATORY_CARE_PROVIDER_SITE_OTHER): Payer: 59 | Admitting: Nurse Practitioner

## 2022-02-19 ENCOUNTER — Ambulatory Visit (HOSPITAL_COMMUNITY)
Admission: RE | Admit: 2022-02-19 | Discharge: 2022-02-19 | Disposition: A | Discharge status: Home / Self Care | Payer: 59 | Source: Ambulatory Visit | Attending: Nurse Practitioner | Admitting: Nurse Practitioner

## 2022-02-19 ENCOUNTER — Encounter: Payer: Self-pay | Admitting: Nurse Practitioner

## 2022-02-19 VITALS — BP 120/77 | HR 56 | Temp 97.8°F | Resp 20 | Ht 72.0 in | Wt 199.0 lb

## 2022-02-19 DIAGNOSIS — R1901 Right upper quadrant abdominal swelling, mass and lump: Secondary | ICD-10-CM

## 2022-02-19 MED ORDER — IOHEXOL 300 MG/ML  SOLN
100.0000 mL | Freq: Once | INTRAMUSCULAR | Status: AC | PRN
  Administered 2022-02-19: 100 mL via INTRAVENOUS

## 2022-02-19 NOTE — Progress Notes (Signed)
Subjective:    Patient ID: Allen Watson, male    DOB: 1965-07-15, 56 y.o.   MRN: [id number]   Chief Complaint: Pain in right side of rib   Pt comes in today for RUQ x about 4 weeks. He was seen in this office 01/23/22 for same; U/A negative for infection; KUB showed nonobstructive bowel gas pattern. Pain has waxed and waned (see below); pt made return appointment because they are times when he cannot lay on his R side due to the pain.  Abdominal Pain This is a new (ongoing x 4 weeks) problem. The current episode started 1 to 4 weeks ago. The onset quality is sudden. The problem occurs constantly. The problem has been waxing and waning. The pain is located in the RUQ. The pain is at a severity of 2/10. The pain is mild. The quality of the pain is aching. The abdominal pain does not radiate. Pertinent negatives include no diarrhea, dysuria, fever, nausea or vomiting. The pain is aggravated by certain positions. The pain is relieved by Standing. He has tried nothing for the symptoms. There is no history of pancreatitis. splenectomy 1995       Review of Systems  Constitutional:  Negative for fever.  Gastrointestinal:  Positive for abdominal pain. Negative for diarrhea, nausea and vomiting.  Genitourinary:  Negative for dysuria.       Objective:   Physical Exam Vitals and nursing note reviewed.  Constitutional:      General: He is not in acute distress.    Appearance: Normal appearance.  Neck:     Vascular: No carotid bruit.  Cardiovascular:     Rate and Rhythm: Regular rhythm. Bradycardia present.     Pulses: Normal pulses.     Heart sounds: Normal heart sounds.  Pulmonary:     Effort: Pulmonary effort is normal. No respiratory distress.     Breath sounds: Normal breath sounds. No wheezing, rhonchi or rales.  Abdominal:     General: Abdomen is flat. Bowel sounds are normal. There is no distension.     Palpations: Abdomen is soft. There is no shifting dullness, fluid wave or  pulsatile mass.     Tenderness: There is abdominal tenderness in the right upper quadrant. There is no guarding or rebound. Negative signs include Murphy's sign, Rovsing's sign and McBurney's sign.     Comments: Questionable palpable mass in RUQ, tender to palpation; palpation of RLQ elicits referred pain in RUQ  Lymphadenopathy:     Cervical: No cervical adenopathy.  Skin:    General: Skin is warm and dry.     Coloration: Skin is not jaundiced.  Neurological:     General: No focal deficit present.     Mental Status: He is alert and oriented to person, place, and time.  Psychiatric:        Mood and Affect: Mood normal.        Behavior: Behavior normal.      BP 120/77   Pulse (!) 56   Temp 97.8 F (36.6 C) (Temporal)   Resp 20   Ht 6' (1.829 m)   Wt 199 lb (90.3 kg)   SpO2 99%   BMI 26.99 kg/m       Assessment & Plan:   Liz Malady in today with chief complaint of Pain in right side of rib   1. Right upper quadrant abdominal mass Npo Stat CT ordered - CT ABDOMEN W CONTRAST; Future    The above assessment and management plan  was discussed with the patient. The patient verbalized understanding of and has agreed to the management plan. Patient is aware to call the clinic if symptoms persist or worsen. Patient is aware when to return to the clinic for a follow-up visit. Patient educated on when it is appropriate to go to the emergency department.   [name] Hassell Done, FNP

## 2022-02-19 NOTE — Addendum Note (Signed)
Addended by: [name] on: 02/19/2022 12:22 PM   Modules accepted: Orders

## 2022-02-27 ENCOUNTER — Other Ambulatory Visit (HOSPITAL_COMMUNITY): Payer: 59

## 2022-03-03 ENCOUNTER — Ambulatory Visit (HOSPITAL_COMMUNITY)
Admission: RE | Admit: 2022-03-03 | Discharge: 2022-03-03 | Disposition: A | Discharge status: Home / Self Care | Payer: 59 | Source: Ambulatory Visit | Attending: Nurse Practitioner | Admitting: Nurse Practitioner

## 2022-03-03 DIAGNOSIS — R1901 Right upper quadrant abdominal swelling, mass and lump: Secondary | ICD-10-CM | POA: Insufficient documentation

## 2022-04-15 ENCOUNTER — Telehealth: Payer: Self-pay | Admitting: Neurology

## 2022-04-15 NOTE — Telephone Encounter (Signed)
Yes, need to see patient at least once a year, it is ok to be with np or virtual visit

## 2022-04-15 NOTE — Telephone Encounter (Signed)
Patient is calling.  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 Pt is requesting a call back from nurse

## 2022-04-15 NOTE — Telephone Encounter (Signed)
Called pt. Scheduled him for 2/26 @ 1 pm to see Dr. Krista Blue. Pt said thank you for calling.

## 2022-04-30 ENCOUNTER — Encounter: Payer: Self-pay | Admitting: Nurse Practitioner

## 2022-04-30 ENCOUNTER — Ambulatory Visit (INDEPENDENT_AMBULATORY_CARE_PROVIDER_SITE_OTHER): Payer: Managed Care, Other (non HMO) | Admitting: Nurse Practitioner

## 2022-04-30 VITALS — BP 128/80 | HR 57 | Temp 97.9°F | Resp 20 | Ht 72.0 in | Wt 201.0 lb

## 2022-04-30 DIAGNOSIS — Z7901 Long term (current) use of anticoagulants: Secondary | ICD-10-CM

## 2022-04-30 NOTE — Progress Notes (Signed)
   Subjective:    Patient ID: Allen Watson, male    DOB: 1966-02-13, 57 y.o.   MRN: [id number]   Chief Complaint: needs letter about blood thinner  HPI Patient has been on coumadin for many years in was changed to eliquis in June of 2023.eliquis is much better for treatment and does not require INR checks. He is doing well on eliquis with no bleeding issues.   Patient Active Problem List   Diagnosis Date Noted   Essential tremor 08/04/2019   History of pulmonary embolus (PE) 02/24/2016   Chronic anticoagulation 02/24/2016       Review of Systems  Constitutional:  Negative for diaphoresis.  Eyes:  Negative for pain.  Respiratory:  Negative for shortness of breath.   Cardiovascular:  Negative for chest pain, palpitations and leg swelling.  Gastrointestinal:  Negative for abdominal pain.  Endocrine: Negative for polydipsia.  Skin:  Negative for rash.  Neurological:  Negative for dizziness, weakness and headaches.  Hematological:  Does not bruise/bleed easily.  All other systems reviewed and are negative.      Objective:   Physical Exam Vitals and nursing note reviewed.  Constitutional:      Appearance: Normal appearance. He is well-developed.  Neck:     Thyroid: No thyroid mass or thyromegaly.     Vascular: No carotid bruit or JVD.     Trachea: Phonation normal.  Cardiovascular:     Rate and Rhythm: Normal rate and regular rhythm.  Pulmonary:     Effort: Pulmonary effort is normal. No respiratory distress.     Breath sounds: Normal breath sounds.  Abdominal:     General: Bowel sounds are normal.     Palpations: Abdomen is soft.     Tenderness: There is no abdominal tenderness.  Musculoskeletal:        General: Normal range of motion.     Cervical back: Normal range of motion and neck supple.  Lymphadenopathy:     Cervical: No cervical adenopathy.  Skin:    General: Skin is warm and dry.  Neurological:     Mental Status: He is alert and oriented to person,  place, and time.  Psychiatric:        Behavior: Behavior normal.        Thought Content: Thought content normal.        Judgment: Judgment normal.     BP 128/80   Pulse (!) 57   Temp 97.9 F (36.6 C) (Temporal)   Resp 20   Ht 6' (1.829 m)   Wt 201 lb (91.2 kg)   SpO2 98%   BMI 27.26 kg/m        Assessment & Plan:   Melchor Amour in today with chief complaint of Annual Exam   1. Chronic anticoagulation Continue eliquis as prescribed    The above assessment and management plan was discussed with the patient. The patient verbalized understanding of and has agreed to the management plan. Patient is aware to call the clinic if symptoms persist or worsen. Patient is aware when to return to the clinic for a follow-up visit. Patient educated on when it is appropriate to go to the emergency department.   [name] Hassell Done, FNP

## 2022-05-04 ENCOUNTER — Ambulatory Visit (INDEPENDENT_AMBULATORY_CARE_PROVIDER_SITE_OTHER): Payer: Managed Care, Other (non HMO) | Admitting: Neurology

## 2022-05-04 ENCOUNTER — Encounter: Payer: Self-pay | Admitting: Neurology

## 2022-05-04 VITALS — BP 129/74 | HR 64 | Ht 72.0 in | Wt 202.0 lb

## 2022-05-04 DIAGNOSIS — G250 Essential tremor: Secondary | ICD-10-CM | POA: Diagnosis not present

## 2022-05-04 NOTE — Progress Notes (Signed)
PATIENT: Allen Watson DOB: 1965/07/02  Chief Complaint  Patient presents with   Follow-up    Rm 15. Alone. Patient needs flight letter from [employer]. No changes in tremor.     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  I have reviewed and summarized the referring note from the referring physician.  He is on chronic Coumadin treatment for history of PE happened in 2008, presented with chest pain then, likely PE in 2004.  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  He continues to have bilateral hands tremor, not taking any medication for it.  UPDATE Feb 26th 2024: He is doing well, continue to have bilateral hand posturing and action tremor, is not taking any medication for it, does not affecting his ability to drive, or fly helicopter, needs letter to [employer]: Full 14 system review of systems performed and notable only for as  above All other review of systems were negative.  ALLERGIES: No Known Allergies  HOME MEDICATIONS: Current Outpatient Medications  Medication Sig Dispense Refill   calcium citrate-vitamin D (CITRACAL+D) 315-200 MG-UNIT per tablet Take 1 tablet by mouth 2 (two) times daily.     ELIQUIS 2.5 MG TABS tablet TAKE 1 TABLET TWICE A DAY 180 tablet 3   Multiple Vitamin (MULTIVITAMIN WITH MINERALS) TABS tablet Take 1 tablet by mouth daily.     No current facility-administered medications for this visit.    PAST MEDICAL HISTORY: Past Medical History:  Diagnosis Date   Pulmonary embolism ([place])     PAST SURGICAL HISTORY: Past Surgical History:  Procedure Laterality Date   SPLEENECTOMY  1990'S    FAMILY HISTORY: Family History  Problem Relation Age of Onset   Bipolar disorder Mother    Diabetes Father    Lung cancer Paternal Uncle        x 2 uncles   Colon cancer Neg Hx    Stomach cancer Neg Hx    Rectal cancer Neg Hx    Esophageal cancer Neg Hx    Liver cancer Neg Hx     SOCIAL HISTORY: Social History   Socioeconomic History   Marital status: Married    Spouse name: Not on file   Number of children: 2   Years of education: Not on file   Highest education level: Not on file  Occupational History   Occupation: Dietitian  Tobacco Use   Smoking status: Never  Smokeless tobacco: Never  Vaping Use   Vaping Use: Never used  Substance and Sexual Activity   Alcohol use: Yes    Comment: socially   Drug use: No   Sexual activity: Not on file  Other Topics Concern   Not on file  Social History Narrative   Not on file   Social Determinants of Health   Financial Resource Strain: Not on file  Food Insecurity: Not on file  Transportation Needs: Not on file  Physical Activity: Not on file  Stress: Not on file  Social Connections: Not on file  Intimate Partner Violence: Not on file     PHYSICAL EXAM   Vitals:   05/04/22 1245  BP: 129/74  Pulse: 64   Weight: 202 lb (91.6 kg)  Height: 6' (1.829 m)   Not recorded     Body mass index is 27.4 kg/m.  PHYSICAL EXAMNIATION:  Gen: NAD, conversant, well nourised, well groomed                    NEUROLOGICAL EXAM:  MENTAL STATUS: Speech/cognition: Awake, alert, oriented to history taking and casual conversation   CRANIAL NERVES: CN II: Visual fields are full to confrontation. Pupils are round equal and briskly reactive to light. CN III, IV, VI: extraocular movement are normal. No ptosis. CN V: Facial sensation is intact to light touch CN VII: Face is symmetric with normal eye closure  CN VIII: Hearing is normal to causal conversation. CN IX, X: Phonation is normal. CN XI: Head turning and shoulder shrug are intact  MOTOR: He has  bilateral hands posturing tremor, normal muscle bulk, no rigidity no bradykinesia, and strength were normal      SENSORY: Intact to light touch   COORDINATION: There is no trunk or limb dysmetria noted.  GAIT/STANCE: Posture is normal. Gait is steady     DIAGNOSTIC DATA (LABS, IMAGING, TESTING) - I reviewed patient records, labs, notes, testing and imaging myself where available.   ASSESSMENT AND PLAN  Allen Watson is a 57 y.o. male   Familial essential tremor  That is confirmed by his longstanding clinical presentation, strong family history, mild bilateral postural tremor on examinations  Laboratory evaluation previously showed no treatable etiology, including normal TSH, CMP, CBC in May 2021  Letter to [facility] was written,  Only return to clinic for new issues   Marcial Pacas, M.D. Ph.D.  [facility] [address], [place], [place] [address] Ph: [phone] Fax: [phone]  CC: Chevis Pretty, FNP

## 2022-12-14 ENCOUNTER — Other Ambulatory Visit: Payer: Self-pay | Admitting: Nurse Practitioner

## 2022-12-14 NOTE — Telephone Encounter (Signed)
Last office visit 04/30/22 Last refill 12/16/2021 #180, 3 refills

## 2023-01-20 ENCOUNTER — Encounter: Payer: Self-pay | Admitting: Family Medicine

## 2023-01-20 ENCOUNTER — Ambulatory Visit (INDEPENDENT_AMBULATORY_CARE_PROVIDER_SITE_OTHER): Payer: Managed Care, Other (non HMO) | Admitting: Family Medicine

## 2023-01-20 VITALS — BP 120/75 | HR 54 | Temp 98.1°F | Ht 72.0 in | Wt 197.4 lb

## 2023-01-20 DIAGNOSIS — R1012 Left upper quadrant pain: Secondary | ICD-10-CM | POA: Diagnosis not present

## 2023-01-20 NOTE — Progress Notes (Signed)
Acute Office Visit  Subjective:     Patient ID: Allen Watson, male    DOB: 1965-08-17, 57 y.o.   MRN: [id number]  Chief Complaint  Patient presents with   side pain    Abdominal Pain This is a new problem. Episode onset: 2 weeks. The onset quality is sudden (after rolling over in bed). The problem has been gradually improving. The pain is located in the LLQ. The pain is mild. The quality of the pain is aching. The abdominal pain does not radiate. Pertinent negatives include no anorexia, arthralgias, constipation, diarrhea, dysuria, fever, flatus, frequency, hematochezia, hematuria, melena, myalgias, nausea, vomiting or weight loss. The pain is aggravated by movement (palpation, lying on this side at night). The pain is relieved by Nothing. Treatments tried: heating pad. The treatment provided no relief. His past medical history is significant for abdominal surgery (hx of splenectomy).   Denies heartburn, indigestion.  Review of Systems  Constitutional:  Negative for fever and weight loss.  Gastrointestinal:  Positive for abdominal pain. Negative for anorexia, constipation, diarrhea, flatus, hematochezia, melena, nausea and vomiting.  Genitourinary:  Negative for dysuria, frequency and hematuria.  Musculoskeletal:  Negative for arthralgias and myalgias.        Objective:    BP 120/75   Pulse (!) 54   Temp 98.1 F (36.7 C) (Temporal)   Ht 6' (1.829 m)   Wt 197 lb 6 oz (89.5 kg)   SpO2 98%   BMI 26.77 kg/m    Physical Exam Vitals and nursing note reviewed.  Constitutional:      General: He is not in acute distress.    Appearance: He is not ill-appearing, toxic-appearing or diaphoretic.  Eyes:     General: No scleral icterus. Cardiovascular:     Rate and Rhythm: Normal rate and regular rhythm.     Heart sounds: Normal heart sounds. No murmur heard. Pulmonary:     Effort: Pulmonary effort is normal. No respiratory distress.     Breath sounds: Normal breath sounds. No  wheezing.  Abdominal:     General: Abdomen is flat. A surgical scar is present. Bowel sounds are normal. There is no distension.     Palpations: Abdomen is soft.     Tenderness: There is abdominal tenderness in the left upper quadrant. There is no guarding or rebound. Negative signs include Murphy's sign and McBurney's sign.  Musculoskeletal:     Cervical back: Neck supple. No rigidity.     Right lower leg: No edema.     Left lower leg: No edema.  Skin:    General: Skin is warm and dry.     Coloration: Skin is not jaundiced.  Neurological:     General: No focal deficit present.     Mental Status: He is alert and oriented to person, place, and time.  Psychiatric:        Mood and Affect: Mood normal.        Behavior: Behavior normal.     No results found for any visits on 01/20/23.      Assessment & Plan:   Eriverto Lazur" was seen today for side pain.  Diagnoses and all orders for this visit:  LUQ abdominal pain Improving. Suspect MSK as pain started after rolling over in bed and occurs with palpations and certain movements only. Discussed tylenol, NSAIDs and that symptoms should continue to improve over next few week. Hx of splenectomy. Return to office for new or worsening symptoms, or if symptoms persist.  The patient indicates understanding of these issues and agrees with the plan.  Gabriel Earing, FNP

## 2023-01-22 ENCOUNTER — Ambulatory Visit: Payer: Managed Care, Other (non HMO) | Admitting: Nurse Practitioner

## 2023-05-03 ENCOUNTER — Encounter: Payer: Self-pay | Admitting: Nurse Practitioner

## 2023-05-03 ENCOUNTER — Ambulatory Visit (INDEPENDENT_AMBULATORY_CARE_PROVIDER_SITE_OTHER): Payer: Managed Care, Other (non HMO)

## 2023-05-03 ENCOUNTER — Ambulatory Visit (INDEPENDENT_AMBULATORY_CARE_PROVIDER_SITE_OTHER): Payer: Managed Care, Other (non HMO) | Admitting: Nurse Practitioner

## 2023-05-03 VITALS — BP 127/80 | HR 69 | Temp 98.8°F | Ht 72.0 in | Wt 208.0 lb

## 2023-05-03 DIAGNOSIS — Z0000 Encounter for general adult medical examination without abnormal findings: Secondary | ICD-10-CM | POA: Diagnosis not present

## 2023-05-03 DIAGNOSIS — Z86711 Personal history of pulmonary embolism: Secondary | ICD-10-CM | POA: Diagnosis not present

## 2023-05-03 DIAGNOSIS — Z125 Encounter for screening for malignant neoplasm of prostate: Secondary | ICD-10-CM | POA: Diagnosis not present

## 2023-05-03 NOTE — Progress Notes (Signed)
 Subjective:    Patient ID: Aubrey Voong, male    DOB: [birth year], 58 y.o.   MRN: [id number]   Chief Complaint: medical management of chronic issues     HPI:  Cheskel Silverio is a 58 y.o. who identifies as a male who was assigned male at birth.   Social history: Lives with: wife Work history: Occupational hygienist   Comes in today for follow up of the following chronic medical issues:  1. Annual physical exam Patient as no current medidical problems  2. History of PE Patient ad PE many years ago. 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 He was  on coumadin for years. He changed to eliquis in the last year and likes it better because he does not have to come in office for INR checks. He is doing well with no medication side effects.  New complaints: None today  No Known Allergies Outpatient Encounter Medications as of 05/03/2023  Medication Sig   calcium citrate-vitamin D (CITRACAL+D) 315-200 MG-UNIT per tablet Take 1 tablet by mouth 2 (two) times daily.   ELIQUIS 2.5 MG TABS tablet TAKE 1 TABLET TWICE A DAY   Multiple Vitamin (MULTIVITAMIN WITH MINERALS) TABS tablet Take 1 tablet by mouth daily.   No facility-administered encounter medications on file as of 05/03/2023.    Past Surgical History:  Procedure Laterality Date   SPLEENECTOMY  1990'S    Family History  Problem Relation Age of Onset   Bipolar disorder Mother    Diabetes Father    Lung cancer Paternal Uncle        x 2 uncles   Colon cancer Neg Hx    Stomach cancer Neg Hx    Rectal cancer Neg Hx    Esophageal cancer Neg Hx    Liver cancer Neg Hx       Controlled substance contract: n/a     Review of Systems  Constitutional:  Negative for diaphoresis.  Eyes:  Negative for pain.  Respiratory:  Negative for shortness of breath.   Cardiovascular:  Negative for chest pain, palpitations and leg swelling.  Gastrointestinal:  Negative for abdominal pain.  Endocrine: Negative for  polydipsia.  Skin:  Negative for rash.  Neurological:  Negative for dizziness, weakness and headaches.  Hematological:  Does not bruise/bleed easily.  All other systems reviewed and are negative.      Objective:   Physical Exam Vitals and nursing note reviewed.  Constitutional:      Appearance: Normal appearance. He is well-developed.  HENT:     Head: Normocephalic.     Nose: Nose normal.     Mouth/Throat:     Mouth: Mucous membranes are moist.     Pharynx: Oropharynx is clear.  Eyes:     Pupils: Pupils are equal, round, and reactive to light.  Neck:     Thyroid: No thyroid mass or thyromegaly.     Vascular: No carotid bruit or JVD.     Trachea: Phonation normal.  Cardiovascular:     Rate and Rhythm: Normal rate and regular rhythm.  Pulmonary:     Effort: Pulmonary effort is normal. No respiratory distress.     Breath sounds: Normal breath sounds.  Abdominal:     General: Bowel sounds are normal.     Palpations: Abdomen is soft.     Tenderness: There is no abdominal tenderness.  Musculoskeletal:        General: Normal range of motion.  Cervical back: Normal range of motion and neck supple.  Lymphadenopathy:     Cervical: No cervical adenopathy.  Skin:    General: Skin is warm and dry.  Neurological:     Mental Status: He is alert and oriented to person, place, and time.  Psychiatric:        Behavior: Behavior normal.        Thought Content: Thought content normal.        Judgment: Judgment normal.    BP 127/80   Pulse 69   Temp 98.8 F (37.1 C) (Temporal)   Ht 6' (1.829 m)   Wt 208 lb (94.3 kg)   SpO2 95%   BMI 28.21 kg/m         Assessment & Plan:  Matheo Rathbone comes in today with chief complaint of Annual Exam   Diagnosis and orders addressed:  1. Annual physical exam (Primary) Labs pending - CBC with Differential/Platelet - CMP14+EGFR - Lipid panel - EKG 12-Lead - DG Chest 2 View  2. History of pulmonary embolus (PE) Report any  SOB  3. Prostate cancer screening Labs pending - PSA, total and free   Labs pending Health Maintenance reviewed Diet and exercise encouraged  Follow up plan: prn   [name] [name], FNP

## 2023-05-04 ENCOUNTER — Ambulatory Visit (INDEPENDENT_AMBULATORY_CARE_PROVIDER_SITE_OTHER): Payer: Managed Care, Other (non HMO) | Admitting: Neurology

## 2023-05-04 ENCOUNTER — Encounter: Payer: Self-pay | Admitting: Neurology

## 2023-05-04 VITALS — BP 134/82 | HR 66 | Ht 72.0 in | Wt 214.0 lb

## 2023-05-04 DIAGNOSIS — G250 Essential tremor: Secondary | ICD-10-CM

## 2023-05-04 LAB — LIPID PANEL
Chol/HDL Ratio: 3.2 ratio (ref 0.0–5.0)
Cholesterol, Total: 235 mg/dL — ABNORMAL HIGH (ref 100–199)
HDL: 74 mg/dL (ref >39)
LDL Chol Calc (NIH): 147 mg/dL — ABNORMAL HIGH (ref 0–99)
Triglycerides: 83 mg/dL (ref 0–149)
VLDL Cholesterol Cal: 14 mg/dL (ref 5–40)

## 2023-05-04 LAB — CBC WITH DIFFERENTIAL/PLATELET
Basophils Absolute: 0.1 10*3/uL (ref 0.0–0.2)
Basos: 2 %
EOS (ABSOLUTE): 0.2 10*3/uL (ref 0.0–0.4)
Eos: 4 %
Hematocrit: 45.6 % (ref 37.5–51.0)
Hemoglobin: 15.5 g/dL (ref 13.0–17.7)
Immature Grans (Abs): 0 10*3/uL (ref 0.0–0.1)
Immature Granulocytes: 1 %
Lymphocytes Absolute: 2.5 10*3/uL (ref 0.7–3.1)
Lymphs: 38 %
MCH: 30.9 pg (ref 26.6–33.0)
MCHC: 34 g/dL (ref 31.5–35.7)
MCV: 91 fL (ref 79–97)
Monocytes Absolute: 0.8 10*3/uL (ref 0.1–0.9)
Monocytes: 11 %
Neutrophils Absolute: 3 10*3/uL (ref 1.4–7.0)
Neutrophils: 44 %
Platelets: 296 10*3/uL (ref 150–450)
RBC: 5.01 x10E6/uL (ref 4.14–5.80)
RDW: 12.8 % (ref 11.6–15.4)
WBC: 6.6 10*3/uL (ref 3.4–10.8)

## 2023-05-04 LAB — CMP14+EGFR
ALT: 17 IU/L (ref 0–44)
AST: 28 IU/L (ref 0–40)
Albumin: 4.5 g/dL (ref 3.8–4.9)
Alkaline Phosphatase: 58 IU/L (ref 44–121)
BUN/Creatinine Ratio: 20 (ref 9–20)
BUN: 19 mg/dL (ref 6–24)
Bilirubin Total: 0.5 mg/dL (ref 0.0–1.2)
CO2: 23 mmol/L (ref 20–29)
Calcium: 9.6 mg/dL (ref 8.7–10.2)
Chloride: 104 mmol/L (ref 96–106)
Creatinine, Ser: 0.95 mg/dL (ref 0.76–1.27)
Globulin, Total: 2.2 g/dL (ref 1.5–4.5)
Glucose: 86 mg/dL (ref 70–99)
Potassium: 4.2 mmol/L (ref 3.5–5.2)
Sodium: 139 mmol/L (ref 134–144)
Total Protein: 6.7 g/dL (ref 6.0–8.5)
eGFR: 93 mL/min/{1.73_m2} (ref >59)

## 2023-05-04 LAB — PSA, TOTAL AND FREE
PSA, Free Pct: 26.7 %
PSA, Free: 0.24 ng/mL
Prostate Specific Ag, Serum: 0.9 ng/mL (ref 0.0–4.0)

## 2023-05-04 NOTE — Progress Notes (Signed)
 PATIENT: Allen Watson DOB: 13-May-1965  Chief Complaint  Patient presents with   Follow-up    Pt in 15, here alone Pt is here for essential tremor follow up. 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  I have reviewed and summarized the referring note from the referring physician.  He is on chronic Coumadin treatment for history of PE happened in 2008, presented with chest pain then, likely PE in 2004.  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  He continues to have bilateral hands tremor, not taking any medication for it.  UPDATE Feb 26th 2024: He is doing well, continue to have bilateral hand posturing and action tremor, is not taking any medication for it, does not affecting his ability to drive, or fly helicopter, needs letter to [facility]  UPDATE Feb 25th 2025: He is doing very well,  continue to treat and flight medical helicopter, resting his arm on his knee to sturdy his hand, generate the letter to FAA,  REVIEW OF SYSTEMS: Full 14 system review of systems performed and notable only for as above All other review of systems were negative.  ALLERGIES: No Known Allergies  HOME MEDICATIONS: Current Outpatient Medications  Medication Sig Dispense Refill   calcium citrate-vitamin D (CITRACAL+D) 315-200 MG-UNIT per tablet Take 1 tablet by mouth 2 (two) times daily.     ELIQUIS 2.5 MG TABS tablet TAKE 1 TABLET TWICE A DAY 180 tablet 3   Multiple Vitamin (MULTIVITAMIN WITH MINERALS) TABS tablet Take 1 tablet by mouth daily.     No current facility-administered medications for this visit.    PAST MEDICAL HISTORY: Past Medical History:  Diagnosis Date   Pulmonary embolism (HCC)     PAST SURGICAL HISTORY: Past Surgical History:  Procedure Laterality Date   SPLEENECTOMY  1990'S    FAMILY HISTORY: Family History  Problem Relation Age of Onset   Bipolar disorder Mother    Diabetes Father    Lung cancer Paternal Uncle        x 2 uncles   Colon cancer Neg Hx    Stomach cancer Neg Hx    Rectal cancer Neg Hx    Esophageal cancer Neg Hx    Liver cancer Neg Hx     SOCIAL HISTORY: Social History   Socioeconomic History   Marital status: Married  Spouse name: Not on file   Number of children: 2   Years of education: Not on file   Highest education level: Associate degree: academic program  Occupational History   Occupation: Recruitment consultant  Tobacco Use   Smoking status: Never   Smokeless tobacco: Never  Vaping Use   Vaping status: Never Used  Substance and Sexual Activity   Alcohol use: Yes    Comment: socially   Drug use: No   Sexual activity: Not on file  Other Topics Concern   Not on file  Social History Narrative   Not on file   Social Drivers of Health   Financial Resource Strain: Low Risk  (05/02/2023)   Overall Financial Resource  Strain (CARDIA)    Difficulty of Paying Living Expenses: Not hard at all  Food Insecurity: No Food Insecurity (05/02/2023)   Hunger Vital Sign    Worried About Running Out of Food in the Last Year: Never true    Ran Out of Food in the Last Year: Never true  Transportation Needs: No Transportation Needs (05/02/2023)   PRAPARE - Administrator, Civil Service (Medical): No    Lack of Transportation (Non-Medical): No  Physical Activity: Sufficiently Active (05/02/2023)   Exercise Vital Sign    Days of Exercise per Week: 5 days    Minutes of Exercise per Session: 50 min  Stress: No Stress Concern Present (05/02/2023)   [employer] of Occupational Health - Occupational Stress Questionnaire    Feeling of Stress : Not at all  Social Connections: Moderately Integrated (05/02/2023)   Social Connection and Isolation Panel [NHANES]    Frequency of Communication with Friends and Family: Never    Frequency of Social Gatherings with Friends and Family: More than three times a week    Attends Religious Services: 1 to 4 times per year    Active Member of [employer] or Organizations: No    Attends Engineer, structural: Not on file    Marital Status: Married  Catering manager Violence: Not on file     PHYSICAL EXAM   Vitals:   05/04/23 1006  BP: 134/82  Pulse: 66  Weight: 214 lb (97.1 kg)  Height: 6' (1.829 m)     Body mass index is 29.02 kg/m.  PHYSICAL EXAMNIATION:  Gen: NAD, conversant, well nourised, well groomed                    NEUROLOGICAL EXAM:  MENTAL STATUS: Speech/cognition: Awake, alert, oriented to history taking and casual conversation   CRANIAL NERVES: CN II: Visual fields are full to confrontation. Pupils are round equal and briskly reactive to light. CN III, IV, VI: extraocular movement are normal. No ptosis. CN V: Facial sensation is intact to light touch CN VII: Face is symmetric with normal eye closure  CN VIII: Hearing is normal to causal  conversation. CN IX, X: Phonation is normal. CN XI: Head turning and shoulder shrug are intact  MOTOR: He has  bilateral hands posturing tremor, normal muscle bulk, no rigidity no bradykinesia, and strength were normal      SENSORY: Intact to light touch   COORDINATION: There is no trunk or limb dysmetria noted.  GAIT/STANCE: Posture is normal. Gait is steady     DIAGNOSTIC DATA (LABS, IMAGING, TESTING) - I reviewed patient records, labs, notes, testing and imaging myself where available.   ASSESSMENT AND PLAN  Allen Watson is a 58 y.o. male   Familial essential  tremor  That is confirmed by his longstanding clinical presentation, strong family history, mild bilateral postural tremor on examinations  Laboratory evaluation previously showed no treatable etiology, including normal TSH, CMP, CBC in May 2021  Letter to [facility] was written,  Only return to clinic for new issues   Levert Feinstein, M.D. Ph.D.  [facility] [address], [address] [place], [place] [address] Ph: [phone] Fax: [phone]  CC: [name], FNP

## 2023-12-09 ENCOUNTER — Other Ambulatory Visit: Payer: Self-pay | Admitting: Nurse Practitioner

## 2024-02-02 ENCOUNTER — Ambulatory Visit: Admitting: Family Medicine

## 2024-03-08 ENCOUNTER — Other Ambulatory Visit: Payer: Self-pay | Admitting: Family

## 2024-04-27 ENCOUNTER — Ambulatory Visit: Admitting: Neurology

## 2024-05-01 ENCOUNTER — Ambulatory Visit: Admitting: Nurse Practitioner

## 2024-06-20 ENCOUNTER — Ambulatory Visit: Admitting: Neurology
